# Patient Record
Sex: Female | Born: 1995 | Race: White | Hispanic: No | Marital: Single | Smoking: Current every day smoker
Health system: Southern US, Community
[De-identification: ages and names within clinical notes are randomized; demographics above are authoritative.]

## PROBLEM LIST (undated history)

## (undated) DIAGNOSIS — G43909 Migraine, unspecified, not intractable, without status migrainosus: Secondary | ICD-10-CM

## (undated) DIAGNOSIS — F419 Anxiety disorder, unspecified: Secondary | ICD-10-CM

## (undated) DIAGNOSIS — B009 Herpesviral infection, unspecified: Secondary | ICD-10-CM

## (undated) DIAGNOSIS — J45909 Unspecified asthma, uncomplicated: Secondary | ICD-10-CM

## (undated) HISTORY — DX: Migraine, unspecified, not intractable, without status migrainosus: G43.909

## (undated) HISTORY — DX: Herpesviral infection, unspecified: B00.9

## (undated) HISTORY — PX: SPLENECTOMY, PARTIAL: SHX787

## (undated) HISTORY — DX: Unspecified asthma, uncomplicated: J45.909

## (undated) HISTORY — DX: Anxiety disorder, unspecified: F41.9

---
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
## 2017-08-03 ENCOUNTER — Other Ambulatory Visit: Payer: Self-pay | Admitting: Obstetrics & Gynecology

## 2017-08-03 DIAGNOSIS — O3680X0 Pregnancy with inconclusive fetal viability, not applicable or unspecified: Secondary | ICD-10-CM

## 2017-08-08 ENCOUNTER — Ambulatory Visit (INDEPENDENT_AMBULATORY_CARE_PROVIDER_SITE_OTHER): Payer: Medicaid Other

## 2017-08-08 ENCOUNTER — Encounter (INDEPENDENT_AMBULATORY_CARE_PROVIDER_SITE_OTHER): Payer: Self-pay

## 2017-08-08 DIAGNOSIS — O3680X0 Pregnancy with inconclusive fetal viability, not applicable or unspecified: Secondary | ICD-10-CM | POA: Diagnosis not present

## 2017-08-08 DIAGNOSIS — Z3A10 10 weeks gestation of pregnancy: Secondary | ICD-10-CM

## 2017-08-08 NOTE — Progress Notes (Signed)
US 10 wks,single IUP,CRL 31.58 mm,normal left ovary,simple right ovarian cyst 4.8 x 4 x 4.3 cm,positive fht 176 bpm,EDD 03/06/2018 by LMP

## 2017-08-10 ENCOUNTER — Telehealth: Payer: Self-pay | Admitting: Advanced Practice Midwife

## 2017-08-10 NOTE — Telephone Encounter (Signed)
Pt called asking if it was okay to use her albuterol inhaler during pregnancy. Informed pt that it was safe to use.

## 2017-08-23 ENCOUNTER — Ambulatory Visit: Payer: Self-pay | Admitting: *Deleted

## 2017-08-23 ENCOUNTER — Encounter: Payer: Self-pay | Admitting: Advanced Practice Midwife

## 2017-08-23 ENCOUNTER — Ambulatory Visit (INDEPENDENT_AMBULATORY_CARE_PROVIDER_SITE_OTHER): Payer: Self-pay | Admitting: Advanced Practice Midwife

## 2017-08-23 VITALS — BP 112/64 | HR 88 | Ht 64.0 in | Wt 127.0 lb

## 2017-08-23 DIAGNOSIS — Z331 Pregnant state, incidental: Secondary | ICD-10-CM

## 2017-08-23 DIAGNOSIS — O99341 Other mental disorders complicating pregnancy, first trimester: Secondary | ICD-10-CM

## 2017-08-23 DIAGNOSIS — Z3401 Encounter for supervision of normal first pregnancy, first trimester: Secondary | ICD-10-CM

## 2017-08-23 DIAGNOSIS — Z3A12 12 weeks gestation of pregnancy: Secondary | ICD-10-CM | POA: Diagnosis not present

## 2017-08-23 DIAGNOSIS — Z1389 Encounter for screening for other disorder: Secondary | ICD-10-CM

## 2017-08-23 DIAGNOSIS — Z3400 Encounter for supervision of normal first pregnancy, unspecified trimester: Secondary | ICD-10-CM | POA: Insufficient documentation

## 2017-08-23 DIAGNOSIS — F419 Anxiety disorder, unspecified: Secondary | ICD-10-CM

## 2017-08-23 LAB — POCT URINALYSIS DIPSTICK
Blood, UA: NEGATIVE
Glucose, UA: NEGATIVE
Ketones, UA: NEGATIVE
Leukocytes, UA: NEGATIVE
Nitrite, UA: NEGATIVE
Protein, UA: NEGATIVE

## 2017-08-23 NOTE — Patient Instructions (Signed)
 First Trimester of Pregnancy The first trimester of pregnancy is from week 1 until the end of week 12 (months 1 through 3). A week after a sperm fertilizes an egg, the egg will implant on the wall of the uterus. This embryo will begin to develop into a baby. Genes from you and your partner are forming the baby. The female genes determine whether the baby is a boy or a girl. At 6-8 weeks, the eyes and face are formed, and the heartbeat can be seen on ultrasound. At the end of 12 weeks, all the baby's organs are formed.  Now that you are pregnant, you will want to do everything you can to have a healthy baby. Two of the most important things are to get good prenatal care and to follow your health care provider's instructions. Prenatal care is all the medical care you receive before the baby's birth. This care will help prevent, find, and treat any problems during the pregnancy and childbirth. BODY CHANGES Your body goes through many changes during pregnancy. The changes vary from woman to woman.   You may gain or lose a couple of pounds at first.  You may feel sick to your stomach (nauseous) and throw up (vomit). If the vomiting is uncontrollable, call your health care provider.  You may tire easily.  You may develop headaches that can be relieved by medicines approved by your health care provider.  You may urinate more often. Painful urination may mean you have a bladder infection.  You may develop heartburn as a result of your pregnancy.  You may develop constipation because certain hormones are causing the muscles that push waste through your intestines to slow down.  You may develop hemorrhoids or swollen, bulging veins (varicose veins).  Your breasts may begin to grow larger and become tender. Your nipples may stick out more, and the tissue that surrounds them (areola) may become darker.  Your gums may bleed and may be sensitive to brushing and flossing.  Dark spots or blotches  (chloasma, mask of pregnancy) may develop on your face. This will likely fade after the baby is born.  Your menstrual periods will stop.  You may have a loss of appetite.  You may develop cravings for certain kinds of food.  You may have changes in your emotions from day to day, such as being excited to be pregnant or being concerned that something may go wrong with the pregnancy and baby.  You may have more vivid and strange dreams.  You may have changes in your hair. These can include thickening of your hair, rapid growth, and changes in texture. Some women also have hair loss during or after pregnancy, or hair that feels dry or thin. Your hair will most likely return to normal after your baby is born. WHAT TO EXPECT AT YOUR PRENATAL VISITS During a routine prenatal visit:  You will be weighed to make sure you and the baby are growing normally.  Your blood pressure will be taken.  Your abdomen will be measured to track your baby's growth.  The fetal heartbeat will be listened to starting around week 10 or 12 of your pregnancy.  Test results from any previous visits will be discussed. Your health care provider may ask you:  How you are feeling.  If you are feeling the baby move.  If you have had any abnormal symptoms, such as leaking fluid, bleeding, severe headaches, or abdominal cramping.  If you have any questions. Other   tests that may be performed during your first trimester include:  Blood tests to find your blood type and to check for the presence of any previous infections. They will also be used to check for low iron levels (anemia) and Rh antibodies. Later in the pregnancy, blood tests for diabetes will be done along with other tests if problems develop.  Urine tests to check for infections, diabetes, or protein in the urine.  An ultrasound to confirm the proper growth and development of the baby.  An amniocentesis to check for possible genetic problems.  Fetal  screens for spina bifida and Down syndrome.  You may need other tests to make sure you and the baby are doing well. HOME CARE INSTRUCTIONS  Medicines  Follow your health care provider's instructions regarding medicine use. Specific medicines may be either safe or unsafe to take during pregnancy.  Take your prenatal vitamins as directed.  If you develop constipation, try taking a stool softener if your health care provider approves. Diet  Eat regular, well-balanced meals. Choose a variety of foods, such as meat or vegetable-based protein, fish, milk and low-fat dairy products, vegetables, fruits, and whole grain breads and cereals. Your health care provider will help you determine the amount of weight gain that is right for you.  Avoid raw meat and uncooked cheese. These carry germs that can cause birth defects in the baby.  Eating four or five small meals rather than three large meals a day may help relieve nausea and vomiting. If you start to feel nauseous, eating a few soda crackers can be helpful. Drinking liquids between meals instead of during meals also seems to help nausea and vomiting.  If you develop constipation, eat more high-fiber foods, such as fresh vegetables or fruit and whole grains. Drink enough fluids to keep your urine clear or pale yellow. Activity and Exercise  Exercise only as directed by your health care provider. Exercising will help you:  Control your weight.  Stay in shape.  Be prepared for labor and delivery.  Experiencing pain or cramping in the lower abdomen or low back is a good sign that you should stop exercising. Check with your health care provider before continuing normal exercises.  Try to avoid standing for long periods of time. Move your legs often if you must stand in one place for a long time.  Avoid heavy lifting.  Wear low-heeled shoes, and practice good posture.  You may continue to have sex unless your health care provider directs you  otherwise. Relief of Pain or Discomfort  Wear a good support bra for breast tenderness.   Take warm sitz baths to soothe any pain or discomfort caused by hemorrhoids. Use hemorrhoid cream if your health care provider approves.   Rest with your legs elevated if you have leg cramps or low back pain.  If you develop varicose veins in your legs, wear support hose. Elevate your feet for 15 minutes, 3-4 times a day. Limit salt in your diet. Prenatal Care  Schedule your prenatal visits by the twelfth week of pregnancy. They are usually scheduled monthly at first, then more often in the last 2 months before delivery.  Write down your questions. Take them to your prenatal visits.  Keep all your prenatal visits as directed by your health care provider. Safety  Wear your seat belt at all times when driving.  Make a list of emergency phone numbers, including numbers for family, friends, the hospital, and police and fire departments. General   Tips  Ask your health care provider for a referral to a local prenatal education class. Begin classes no later than at the beginning of month 6 of your pregnancy.  Ask for help if you have counseling or nutritional needs during pregnancy. Your health care provider can offer advice or refer you to specialists for help with various needs.  Do not use hot tubs, steam rooms, or saunas.  Do not douche or use tampons or scented sanitary pads.  Do not cross your legs for long periods of time.  Avoid cat litter boxes and soil used by cats. These carry germs that can cause birth defects in the baby and possibly loss of the fetus by miscarriage or stillbirth.  Avoid all smoking, herbs, alcohol, and medicines not prescribed by your health care provider. Chemicals in these affect the formation and growth of the baby.  Schedule a dentist appointment. At home, brush your teeth with a soft toothbrush and be gentle when you floss. SEEK MEDICAL CARE IF:   You have  dizziness.  You have mild pelvic cramps, pelvic pressure, or nagging pain in the abdominal area.  You have persistent nausea, vomiting, or diarrhea.  You have a bad smelling vaginal discharge.  You have pain with urination.  You notice increased swelling in your face, hands, legs, or ankles. SEEK IMMEDIATE MEDICAL CARE IF:   You have a fever.  You are leaking fluid from your vagina.  You have spotting or bleeding from your vagina.  You have severe abdominal cramping or pain.  You have rapid weight gain or loss.  You vomit blood or material that looks like coffee grounds.  You are exposed to German measles and have never had them.  You are exposed to fifth disease or chickenpox.  You develop a severe headache.  You have shortness of breath.  You have any kind of trauma, such as from a fall or a car accident. Document Released: 08/31/2001 Document Revised: 01/21/2014 Document Reviewed: 07/17/2013 Exit[employer] Patient Information 2015 [employer]. This information is not intended to replace advice given to you by your health care provider. Make sure you discuss any questions you have with your health care provider.   Nausea & Vomiting  Have saltine crackers or pretzels by your bed and eat a few bites before you raise your head out of bed in the morning  Eat small frequent meals throughout the day instead of large meals  Drink plenty of fluids throughout the day to stay hydrated, just don't drink a lot of fluids with your meals.  This can make your stomach fill up faster making you feel sick  Do not brush your teeth right after you eat  Products with real ginger are good for nausea, like ginger ale and ginger hard candy Make sure it says made with real ginger!  Sucking on sour candy like lemon heads is also good for nausea  If your prenatal vitamins make you nauseated, take them at night so you will sleep through the nausea  Sea Bands  If you feel like you need  medicine for the nausea & vomiting please let us know  If you are unable to keep any fluids or food down please let us know   Constipation  Drink plenty of fluid, preferably water, throughout the day  Eat foods high in fiber such as fruits, vegetables, and grains  Exercise, such as walking, is a good way to keep your bowels regular  Drink warm fluids, especially warm   prune juice, or decaf coffee  Eat a 1/2 cup of real oatmeal (not instant), 1/2 cup applesauce, and 1/2-1 cup warm prune juice every day  If needed, you may take Colace (docusate sodium) stool softener once or twice a day to help keep the stool soft. If you are pregnant, wait until you are out of your first trimester (12-14 weeks of pregnancy)  If you still are having problems with constipation, you may take Miralax once daily as needed to help keep your bowels regular.  If you are pregnant, wait until you are out of your first trimester (12-14 weeks of pregnancy)  Safe Medications in Pregnancy   Acne: Benzoyl Peroxide Salicylic Acid  Backache/Headache: Tylenol: 2 regular strength every 4 hours OR              2 Extra strength every 6 hours  Colds/Coughs/Allergies: Benadryl (alcohol free) 25 mg every 6 hours as needed Breath right strips Claritin Cepacol throat lozenges Chloraseptic throat spray Cold-Eeze- up to three times per day Cough drops, alcohol free Flonase (by prescription only) Guaifenesin Mucinex Robitussin DM (plain only, alcohol free) Saline nasal spray/drops Sudafed (pseudoephedrine) & Actifed ** use only after [redacted] weeks gestation and if you do not have high blood pressure Tylenol Vicks Vaporub Zinc lozenges Zyrtec   Constipation: Colace Ducolax suppositories Fleet enema Glycerin suppositories Metamucil Milk of magnesia Miralax Senokot Smooth move tea  Diarrhea: Kaopectate Imodium A-D  *NO pepto Bismol  Hemorrhoids: Anusol Anusol HC Preparation  H Tucks  Indigestion: Tums Maalox Mylanta Zantac  Pepcid  Insomnia: Benadryl (alcohol free) 25mg every 6 hours as needed Tylenol PM Unisom, no Gelcaps  Leg Cramps: Tums MagGel  Nausea/Vomiting:  Bonine Dramamine Emetrol Ginger extract Sea bands Meclizine  Nausea medication to take during pregnancy:  Unisom (doxylamine succinate 25 mg tablets) Take one tablet daily at bedtime. If symptoms are not adequately controlled, the dose can be increased to a maximum recommended dose of two tablets daily (1/2 tablet in the morning, 1/2 tablet mid-afternoon and one at bedtime). Vitamin B6 100mg tablets. Take one tablet twice a day (up to 200 mg per day).  Skin Rashes: Aveeno products Benadryl cream or 25mg every 6 hours as needed Calamine Lotion 1% cortisone cream  Yeast infection: Gyne-lotrimin 7 Monistat 7   **If taking multiple medications, please check labels to avoid duplicating the same active ingredients **take medication as directed on the label ** Do not exceed 4000 mg of tylenol in 24 hours **Do not take medications that contain aspirin or ibuprofen      

## 2017-08-23 NOTE — Progress Notes (Signed)
  Subjective:    Jennifer Obrien is a G1P0 4336w1d being seen today for her first obstetrical visit.  Her obstetrical history is significant for first baby.  Pregnancy history fully reviewed.  Patient reports no complaints.  Vitals:   08/23/17 1424 08/23/17 1425  BP: 112/64   Pulse: 88   Weight: 127 lb (57.6 kg)   Height:  5\' 4"  (1.626 m)    HISTORY: OB History  Gravida Para Term Preterm AB Living  1            SAB TAB Ectopic Multiple Live Births               # Outcome Date GA Lbr Len/2nd Weight Sex Delivery Anes PTL Lv  1 Current              Past Medical History:  Diagnosis Date  . Anxiety   . Asthma   . HSV-1 (herpes simplex virus 1) infection    Past Surgical History:  Procedure Laterality Date  . 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 Hypertension Mother   . Hernia Father   . Diabetes Maternal Uncle   . Hypertension Maternal Grandmother   . Alcohol abuse Maternal Grandfather   . Hypertension Paternal Grandmother      Exam                                      System:     Skin: normal coloration and turgor, no rashes    Neurologic: oriented, normal, normal mood   Extremities: normal strength, tone, and muscle mass   HEENT PERRLA   Mouth/Teeth mucous membranes moist, normal dentition   Neck supple and no masses   Cardiovascular: regular rate and rhythm   Respiratory:  appears well, vitals normal, no respiratory distress, acyanotic   Abdomen: soft, non-tender;  FHR: 160us        The nature [name] with multiple MDs and other Advanced Practice Providers was explained to patient; also emphasized that residents, students are part of our team.  Assessment:    Pregnancy: G1P0 Patient Active Problem List   Diagnosis Date Noted  . Supervision of normal first pregnancy 08/23/2017        Plan:     Initial labs drawn. Continue prenatal vitamins   Problem list reviewed and updated  Reviewed n/v relief measures and warning s/s to report  Reviewed recommended weight gain based on pre-gravid BMI  Encouraged well-balanced diet Genetic Screening discussed : declined.  Ultrasound discussed; fetal survey: requested.  Return in about 4 weeks (around 09/20/2017) for LROB.  CRESENZO-DISHMAN,Shady Bradish 08/23/2017

## 2017-08-24 LAB — OBSTETRIC PANEL, INCLUDING HIV
Antibody Screen: NEGATIVE
Basophils Absolute: 0 10*3/uL (ref 0.0–0.2)
Basos: 0 %
EOS (ABSOLUTE): 0.2 10*3/uL (ref 0.0–0.4)
Eos: 2 %
HIV Screen 4th Generation wRfx: NONREACTIVE
Hematocrit: 37.7 % (ref 34.0–46.6)
Hemoglobin: 12.9 g/dL (ref 11.1–15.9)
Hepatitis B Surface Ag: NEGATIVE
Immature Grans (Abs): 0 10*3/uL (ref 0.0–0.1)
Immature Granulocytes: 0 %
Lymphocytes Absolute: 2.6 10*3/uL (ref 0.7–3.1)
Lymphs: 27 %
MCH: 31.4 pg (ref 26.6–33.0)
MCHC: 34.2 g/dL (ref 31.5–35.7)
MCV: 92 fL (ref 79–97)
Monocytes Absolute: 0.7 10*3/uL (ref 0.1–0.9)
Monocytes: 7 %
Neutrophils Absolute: 6 10*3/uL (ref 1.4–7.0)
Neutrophils: 64 %
Platelets: 230 10*3/uL (ref 150–379)
RBC: 4.11 x10E6/uL (ref 3.77–5.28)
RDW: 13.8 % (ref 12.3–15.4)
RPR Ser Ql: NONREACTIVE
Rh Factor: POSITIVE
Rubella Antibodies, IGG: 4.56 index (ref >0.99)
WBC: 9.5 10*3/uL (ref 3.4–10.8)

## 2017-08-24 LAB — URINALYSIS, ROUTINE W REFLEX MICROSCOPIC
Bilirubin, UA: NEGATIVE
Glucose, UA: NEGATIVE
Ketones, UA: NEGATIVE
Leukocytes, UA: NEGATIVE
Nitrite, UA: NEGATIVE
Protein, UA: NEGATIVE
RBC, UA: NEGATIVE
Specific Gravity, UA: 1.019 (ref 1.005–1.030)
Urobilinogen, Ur: 1 mg/dL (ref 0.2–1.0)
pH, UA: 7 (ref 5.0–7.5)

## 2017-08-24 LAB — VARICELLA ZOSTER ANTIBODY, IGG: Varicella zoster IgG: 900 index (ref >165)

## 2017-08-24 LAB — PMP SCREEN PROFILE (10S), URINE
Amphetamine Scrn, Ur: NEGATIVE ng/mL
BARBITURATE SCREEN URINE: NEGATIVE ng/mL
BENZODIAZEPINE SCREEN, URINE: NEGATIVE ng/mL
CANNABINOIDS UR QL SCN: NEGATIVE ng/mL
Cocaine (Metab) Scrn, Ur: NEGATIVE ng/mL
Creatinine(Crt), U: 188.7 mg/dL (ref 20.0–300.0)
Methadone Screen, Urine: NEGATIVE ng/mL
OXYCODONE+OXYMORPHONE UR QL SCN: NEGATIVE ng/mL
Opiate Scrn, Ur: NEGATIVE ng/mL
Ph of Urine: 6.5 (ref 4.5–8.9)
Phencyclidine Qn, Ur: NEGATIVE ng/mL
Propoxyphene Scrn, Ur: NEGATIVE ng/mL

## 2017-08-25 LAB — URINE CULTURE: Organism ID, Bacteria: NO GROWTH

## 2017-09-20 NOTE — L&D Delivery Note (Addendum)
Delivery Note Jennifer BrooksDakota Obrien is a 22 yo G1 now P1001 s/p SVD at 5244w0d after SOL. Shortly before delivery SROM occurred with clear fluid.   At 9:39 AM after pushing for approximately 50 minutes a viable female was delivered via Vaginal, Spontaneous (Presentation: LOA ; compound hand presentation with anterior hand  ).  APGAR: , 9; weight  pending . Vaginal septum separated during delivery and was hemostatic.  Shortly after delivery cord was clamped and cut and infant was transferred to radiant warmer for brief PPV.   Attention was then turned to active management of the third stage of labor with gentle downward traction via Brandt maneuver. Placenta status: delivered spontaneously, intact, 3-vessel cord.  Cord pH: pending  Anesthesia:  epidural Episiotomy: None Lacerations:  Vaginal septum separated during delivery and was hemostatic; 2nd degree right-sided perineal and right-sided superior labial tear Suture Repair: 3.0 vicryl rapide Est. Blood Loss (mL): 100  Mom to postpartum.  Baby to Couplet care / Skin to Skin.  Jennifer Obrien 03/06/2018, 10:03 AM  .I confirm that I have verified the information documented in the resident's note and that I have also personally reperformed the physical exam and all medical decision making activities.  I was gloved and present for entire delivery SVD without incident No difficulty with shoulders Lacerations as listed above Repair of same supervised by me Jennifer Obrien, CNM  .Please schedule this patient for Postpartum visit in: 4 weeks with the following provider: Any provider For C/S patients schedule nurse incision check in weeks 2 weeks: no Low risk pregnancy complicated by: none Delivery mode:  SVD Anticipated Birth Control:  POPs PP Procedures needed: none  Schedule Integrated BH visit: no

## 2017-09-21 ENCOUNTER — Encounter: Payer: Self-pay | Admitting: Obstetrics and Gynecology

## 2017-09-21 ENCOUNTER — Encounter (INDEPENDENT_AMBULATORY_CARE_PROVIDER_SITE_OTHER): Payer: Self-pay

## 2017-09-21 ENCOUNTER — Ambulatory Visit (INDEPENDENT_AMBULATORY_CARE_PROVIDER_SITE_OTHER): Payer: Self-pay | Admitting: Obstetrics and Gynecology

## 2017-09-21 VITALS — BP 110/62 | HR 85 | Wt 127.6 lb

## 2017-09-21 DIAGNOSIS — Z331 Pregnant state, incidental: Secondary | ICD-10-CM

## 2017-09-21 DIAGNOSIS — Z1389 Encounter for screening for other disorder: Secondary | ICD-10-CM

## 2017-09-21 DIAGNOSIS — Z3A16 16 weeks gestation of pregnancy: Secondary | ICD-10-CM

## 2017-09-21 DIAGNOSIS — Z3402 Encounter for supervision of normal first pregnancy, second trimester: Secondary | ICD-10-CM

## 2017-09-21 LAB — POCT URINALYSIS DIPSTICK
Blood, UA: NEGATIVE
Glucose, UA: NEGATIVE
Ketones, UA: NEGATIVE
Leukocytes, UA: NEGATIVE
Nitrite, UA: NEGATIVE
Protein, UA: NEGATIVE

## 2017-09-21 NOTE — Patient Instructions (Signed)
(  [phone] is the phone number for Pregnancy Classes or hospital tours at [facility].  ° °You will be referred to  [email or web address]   for more information on childbirth classes   °At this site you may register for classes. You may sign up for a waiting list if classes are full. Please SIGN UP FOR THIS!.   When the waiting list becomes long, sometimes new classes can be added. ° ° ° °

## 2017-09-21 NOTE — Progress Notes (Addendum)
Patient ID: Jennifer Obrien, female   DOB: [birth year], 22 y.o.   MRN: [id number]   LOW-RISK PREGNANCY VISIT Patient name: Jennifer Obrien MRN [id number]  Date of birth: [birth year] Chief Complaint:   Routine Prenatal Visit  History of Present Illness:   Jennifer Obrien is a 22 y.o. G1P0 female at 7928w2d with an Estimated Date of Delivery: 03/06/18 being seen today for ongoing management of a low-risk pregnancy.  Today she reports no complaints.  . Vag. Bleeding: None.  Movement: Present. denies leaking of fluid. Review of Systems:   Pertinent items are noted in HPI Denies abnormal vaginal discharge w/ itching/odor/irritation, headaches, visual changes, shortness of breath, chest pain, abdominal pain, severe nausea/vomiting, or problems with urination or bowel movements unless otherwise stated above. Pertinent History Reviewed:  Reviewed past medical,surgical, social, obstetrical and family history.  Reviewed problem list, medications and allergies. Physical Assessment:   Vitals:   09/21/17 1400  BP: 110/62  Pulse: 85  Weight: 127 lb 9.6 oz (57.9 kg)  Body mass index is 21.9 kg/m.        Physical Examination:   General appearance: Well appearing, and in no distress  Mental status: Alert, oriented to person, place, and time  Skin: Warm & dry  Cardiovascular: Normal heart rate noted  Respiratory: Normal respiratory effort, no distress  Abdomen: Soft, gravid, nontender  Pelvic: Cervical exam deferred         Extremities: Edema: None  Fetal Status: Fetal Heart Rate (bpm): 152-154   Movement: Present    Results for orders placed or performed in visit on 09/21/17 (from the past 24 hour(s))  POCT urinalysis dipstick   Collection Time: 09/21/17  2:01 PM  Result Value Ref Range   Color, UA     Clarity, UA     Glucose, UA neg    Bilirubin, UA     Ketones, UA neg    Spec Grav, UA  1.010 - 1.025   Blood, UA neg    pH, UA  5.0 - 8.0   Protein, UA neg    Urobilinogen, UA  0.2 or 1.0  E.U./dL   Nitrite, UA neg    Leukocytes, UA Negative Negative   Appearance     Odor      Assessment & Plan:  1) Low-risk pregnancy G1P0 at 3628w2d with an Estimated Date of Delivery: 03/06/18    Labs/procedures today: Doppler  Plan:  Continue routine obstetrical care   Reviewed: Preterm labor symptoms and general obstetric precautions including but not limited to vaginal bleeding, contractions, leaking of fluid and fetal movement were reviewed in detail with the patient.  All questions were answered  Follow-up: Return in about 4 weeks (around 10/19/2017) for LROB, U/S: OB FU .anatomy. Childbirth classes encouraged: PARTNER travels a lot, Friday/Sat classes more likely  Orders Placed This Encounter  Procedures  . POCT urinalysis dipstick   By signing my name below, I, Diona BrownerJennifer Gorman, attest that this documentation has been prepared under the direction and in the presence of Tilda BurrowFerguson, Shyanna Klingel V, MD. Electronically Signed: Diona BrownerJennifer Gorman, Medical Scribe. 09/21/17. 2:31 PM.  I personally performed the services described in this documentation, which was SCRIBED in my presence. The recorded information has been reviewed and considered accurate. It has been edited as necessary during review. Tilda BurrowJohn V Srinidhi Landers, MD

## 2017-10-18 ENCOUNTER — Other Ambulatory Visit: Payer: Self-pay | Admitting: Obstetrics and Gynecology

## 2017-10-18 DIAGNOSIS — Z363 Encounter for antenatal screening for malformations: Secondary | ICD-10-CM

## 2017-10-19 ENCOUNTER — Ambulatory Visit (INDEPENDENT_AMBULATORY_CARE_PROVIDER_SITE_OTHER): Payer: Medicaid Other | Admitting: Advanced Practice Midwife

## 2017-10-19 ENCOUNTER — Ambulatory Visit (INDEPENDENT_AMBULATORY_CARE_PROVIDER_SITE_OTHER): Payer: Medicaid Other

## 2017-10-19 VITALS — BP 122/66 | HR 80 | Wt 132.0 lb

## 2017-10-19 DIAGNOSIS — N8311 Corpus luteum cyst of right ovary: Secondary | ICD-10-CM

## 2017-10-19 DIAGNOSIS — Z3A20 20 weeks gestation of pregnancy: Secondary | ICD-10-CM

## 2017-10-19 DIAGNOSIS — Z1389 Encounter for screening for other disorder: Secondary | ICD-10-CM

## 2017-10-19 DIAGNOSIS — Z3402 Encounter for supervision of normal first pregnancy, second trimester: Secondary | ICD-10-CM

## 2017-10-19 DIAGNOSIS — Z363 Encounter for antenatal screening for malformations: Secondary | ICD-10-CM

## 2017-10-19 DIAGNOSIS — O3482 Maternal care for other abnormalities of pelvic organs, second trimester: Secondary | ICD-10-CM

## 2017-10-19 DIAGNOSIS — Z331 Pregnant state, incidental: Secondary | ICD-10-CM

## 2017-10-19 LAB — POCT URINALYSIS DIPSTICK
Blood, UA: NEGATIVE
Glucose, UA: NEGATIVE
Ketones, UA: NEGATIVE
Leukocytes, UA: NEGATIVE
Nitrite, UA: NEGATIVE
Protein, UA: NEGATIVE

## 2017-10-19 NOTE — Progress Notes (Signed)
US 20+2 wks,cephalic,cx 3.6 cm,posterior pl gr 0,normal left ovary,simple right corpus luteal cyst 4.6 x 3.2 x 3.9 cm,SVP of fluid 4.6 cm,fhr 157 bpm,EFW 320 g,anatomy complete,no obvious abnormalities

## 2017-10-19 NOTE — Progress Notes (Signed)
G1P0 3475w2d Estimated Date of Delivery: 03/06/18  Blood pressure 122/66, pulse 80, weight 132 lb (59.9 kg), last menstrual period 05/30/2017.   BP weight and urine results all reviewed and noted.  Please refer to the obstetrical flow sheet for the fundal height and fetal heart rate documentation:  US 20+2 wks,cephalic,cx 3.6 cm,posterior pl gr 0,normal left ovary,simple right corpus luteal cyst 4.6 x 3.2 x 3.9 cm,SVP of fluid 4.6 cm,fhr 157 bpm,EFW 320 g,anatomy complete,no obvious abnormalities     Patient reports good fetal movement, denies any bleeding and no rupture of membranes symptoms or regular contractions. Patient is without complaints. All questions were answered.  Orders Placed This Encounter  Procedures  . POCT Urinalysis Dipstick    Plan:  Continued routine obstetrical care,   Return in about 4 weeks (around 11/16/2017) for LROB.

## 2017-11-16 ENCOUNTER — Encounter: Payer: Self-pay | Admitting: Advanced Practice Midwife

## 2017-11-16 ENCOUNTER — Ambulatory Visit (INDEPENDENT_AMBULATORY_CARE_PROVIDER_SITE_OTHER): Payer: Medicaid Other | Admitting: Advanced Practice Midwife

## 2017-11-16 VITALS — BP 120/60 | HR 99 | Wt 141.4 lb

## 2017-11-16 DIAGNOSIS — R1012 Left upper quadrant pain: Secondary | ICD-10-CM

## 2017-11-16 DIAGNOSIS — Z3402 Encounter for supervision of normal first pregnancy, second trimester: Secondary | ICD-10-CM

## 2017-11-16 DIAGNOSIS — O9989 Other specified diseases and conditions complicating pregnancy, childbirth and the puerperium: Secondary | ICD-10-CM

## 2017-11-16 DIAGNOSIS — Z3A24 24 weeks gestation of pregnancy: Secondary | ICD-10-CM

## 2017-11-16 DIAGNOSIS — Z1389 Encounter for screening for other disorder: Secondary | ICD-10-CM

## 2017-11-16 DIAGNOSIS — Z331 Pregnant state, incidental: Secondary | ICD-10-CM

## 2017-11-16 LAB — POCT URINALYSIS DIPSTICK
Blood, UA: NEGATIVE
Glucose, UA: NEGATIVE
Ketones, UA: NEGATIVE
Leukocytes, UA: NEGATIVE
Nitrite, UA: NEGATIVE
Protein, UA: NEGATIVE

## 2017-11-16 NOTE — Progress Notes (Addendum)
  G1P0 1944w2d Estimated Date of Delivery: 03/06/18  Blood pressure 120/60, pulse 99, weight 141 lb 6.4 oz (64.1 kg), last menstrual period 05/30/2017.   BP weight and urine results all reviewed and noted.  Please refer to the obstetrical flow sheet for the fundal height and fetal heart rate documentation:  Patient reports good fetal movement, denies any bleeding and no rupture of membranes symptoms or regular contractions. Patient has LUQ pain for 2 weeks. 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  Feels like the same pain.  All questions were answered.   Physical Assessment:   Vitals:   11/16/17 1411  BP: 120/60  Pulse: 99  Weight: 141 lb 6.4 oz (64.1 kg)  Body mass index is 24.27 kg/m.        Physical Examination:   General appearance: Well appearing, and in no distress  Mental status: Alert, oriented to person, place, and time  Skin: Warm & dry  Cardiovascular: Normal heart rate noted  Respiratory: Normal respiratory effort, no distress  Abdomen: Soft, gravid, nontender  Pelvic: Cervical exam deferred         Extremities: Edema: None  Fetal Status:     Movement: Present    Results for orders placed or performed in visit on 11/16/17 (from the past 24 hour(s))  POCT urinalysis dipstick   Collection Time: 11/16/17  2:18 PM  Result Value Ref Range   Color, UA     Clarity, UA     Glucose, UA neg    Bilirubin, UA     Ketones, UA neg    Spec Grav, UA  1.010 - 1.025   Blood, UA neg    pH, UA  5.0 - 8.0   Protein, UA neg    Urobilinogen, UA  0.2 or 1.0 E.U./dL   Nitrite, UA neg    Leukocytes, UA Negative Negative   Appearance     Odor       Orders Placed This Encounter  Procedures  . POCT urinalysis dipstick    Plan:  Continued routine obstetrical care, Spleen  Return in about 3 weeks (around 12/07/2017) for PN2/LROB.

## 2017-11-16 NOTE — Addendum Note (Signed)
Addended by: Jacklyn ShellRESENZO-DISHMON, Hyrum Shaneyfelt on: 11/16/2017 03:16 PM   Modules accepted: Orders

## 2017-11-16 NOTE — Patient Instructions (Signed)

## 2017-11-17 ENCOUNTER — Telehealth: Payer: Self-pay | Admitting: *Deleted

## 2017-11-17 NOTE — Telephone Encounter (Signed)
LMOVM U/S scheduled for 3/6 @ 9:15 [facility]. Nothing to eat or drink 6 hours prior to U/S.

## 2017-11-23 ENCOUNTER — Telehealth: Payer: Self-pay | Admitting: Obstetrics & Gynecology

## 2017-11-23 ENCOUNTER — Ambulatory Visit (HOSPITAL_COMMUNITY)
Admission: RE | Admit: 2017-11-23 | Discharge: 2017-11-23 | Disposition: A | Discharge status: Home / Self Care | Payer: Medicaid Other | Source: Ambulatory Visit | Attending: Advanced Practice Midwife | Admitting: Advanced Practice Midwife

## 2017-11-23 DIAGNOSIS — R1012 Left upper quadrant pain: Secondary | ICD-10-CM | POA: Insufficient documentation

## 2017-11-23 DIAGNOSIS — D734 Cyst of spleen: Secondary | ICD-10-CM | POA: Insufficient documentation

## 2017-11-25 ENCOUNTER — Encounter: Payer: Self-pay | Admitting: Advanced Practice Midwife

## 2017-12-08 ENCOUNTER — Other Ambulatory Visit: Payer: Medicaid Other

## 2017-12-08 ENCOUNTER — Encounter: Payer: Self-pay | Admitting: Women's Health

## 2017-12-08 ENCOUNTER — Ambulatory Visit (INDEPENDENT_AMBULATORY_CARE_PROVIDER_SITE_OTHER): Payer: Medicaid Other | Admitting: Women's Health

## 2017-12-08 VITALS — BP 102/70 | HR 98 | Wt 144.0 lb

## 2017-12-08 DIAGNOSIS — Z131 Encounter for screening for diabetes mellitus: Secondary | ICD-10-CM

## 2017-12-08 DIAGNOSIS — Z9081 Acquired absence of spleen: Secondary | ICD-10-CM | POA: Insufficient documentation

## 2017-12-08 DIAGNOSIS — Z3402 Encounter for supervision of normal first pregnancy, second trimester: Secondary | ICD-10-CM

## 2017-12-08 DIAGNOSIS — Z113 Encounter for screening for infections with a predominantly sexual mode of transmission: Secondary | ICD-10-CM

## 2017-12-08 DIAGNOSIS — Z3A27 27 weeks gestation of pregnancy: Secondary | ICD-10-CM

## 2017-12-08 DIAGNOSIS — O9989 Other specified diseases and conditions complicating pregnancy, childbirth and the puerperium: Secondary | ICD-10-CM

## 2017-12-08 MED ORDER — VALACYCLOVIR HCL 1 G PO TABS: 2000.0000 mg | ORAL_TABLET | Freq: Two times a day (BID) | ORAL | 3 refills | Status: AC

## 2017-12-08 NOTE — Patient Instructions (Signed)
Jennifer Obrien, I greatly value your feedback.  If you receive a survey following your visit with us today, we appreciate you taking the time to fill it out.  Thanks, Joellyn HaffKim Saje Gallop, CNM, WHNP-BC   Call the office [phone]) or go to [facility] if:  You begin to have strong, frequent contractions  Your water breaks.  Sometimes it is a big gush of fluid, sometimes it is just a trickle that keeps getting your panties wet or running down your legs  You have vaginal bleeding.  It is normal to have a small amount of spotting if your cervix was checked.   You don't feel your baby moving like normal.  If you don't, get you something to eat and drink and lay down and focus on feeling your baby move.  You should feel at least 10 movements in 2 hours.  If you don't, you should call the office or go to [facility].    Tdap Vaccine  It is recommended that you get the Tdap vaccine during the third trimester of EACH pregnancy to help protect your baby from getting pertussis (whooping cough)  27-36 weeks is the BEST time to do this so that you can pass the protection on to your baby. During pregnancy is better than after pregnancy, but if you are unable to get it during pregnancy it will be offered at the hospital.   You can get this vaccine at the health department or your family doctor  Everyone who will be around your baby should also be up-to-date on their vaccines. Adults (who are not pregnant) only need 1 dose of Tdap during adulthood.   Third Trimester of Pregnancy The third trimester is from week 29 through week 42, months 7 through 9. The third trimester is a time when the fetus is growing rapidly. At the end of the ninth month, the fetus is about 20 inches in length and weighs 6-10 pounds.  BODY CHANGES Your body goes through many changes during pregnancy. The changes vary from woman to woman.   Your weight will continue to increase. You can expect to gain 25-35 pounds (11-16 kg) by  the end of the pregnancy.  You may begin to get stretch marks on your hips, abdomen, and breasts.  You may urinate more often because the fetus is moving lower into your pelvis and pressing on your bladder.  You may develop or continue to have heartburn as a result of your pregnancy.  You may develop constipation because certain hormones are causing the muscles that push waste through your intestines to slow down.  You may develop hemorrhoids or swollen, bulging veins (varicose veins).  You may have pelvic pain because of the weight gain and pregnancy hormones relaxing your joints between the bones in your pelvis. Backaches may result from overexertion of the muscles supporting your posture.  You may have changes in your hair. These can include thickening of your hair, rapid growth, and changes in texture. Some women also have hair loss during or after pregnancy, or hair that feels dry or thin. Your hair will most likely return to normal after your baby is born.  Your breasts will continue to grow and be tender. A yellow discharge may leak from your breasts called colostrum.  Your belly button may stick out.  You may feel short of breath because of your expanding uterus.  You may notice the fetus "dropping," or moving lower in your abdomen.  You may have a bloody mucus  discharge. This usually occurs a few days to a week before labor begins.  Your cervix becomes thin and soft (effaced) near your due date. WHAT TO EXPECT AT YOUR PRENATAL EXAMS  You will have prenatal exams every 2 weeks until week 36. Then, you will have weekly prenatal exams. During a routine prenatal visit:  You will be weighed to make sure you and the fetus are growing normally.  Your blood pressure is taken.  Your abdomen will be measured to track your baby's growth.  The fetal heartbeat will be listened to.  Any test results from the previous visit will be discussed.  You may have a cervical check near your  due date to see if you have effaced. At around 36 weeks, your caregiver will check your cervix. At the same time, your caregiver will also perform a test on the secretions of the vaginal tissue. This test is to determine if a type of bacteria, Group B streptococcus, is present. Your caregiver will explain this further. Your caregiver may ask you:  What your birth plan is.  How you are feeling.  If you are feeling the baby move.  If you have had any abnormal symptoms, such as leaking fluid, bleeding, severe headaches, or abdominal cramping.  If you have any questions. Other tests or screenings that may be performed during your third trimester include:  Blood tests that check for low iron levels (anemia).  Fetal testing to check the health, activity level, and growth of the fetus. Testing is done if you have certain medical conditions or if there are problems during the pregnancy. FALSE LABOR You may feel small, irregular contractions that eventually go away. These are called Braxton Hicks contractions, or false labor. Contractions may last for hours, days, or even weeks before true labor sets in. If contractions come at regular intervals, intensify, or become painful, it is best to be seen by your caregiver.  SIGNS OF LABOR   Menstrual-like cramps.  Contractions that are 5 minutes apart or less.  Contractions that start on the top of the uterus and spread down to the lower abdomen and back.  A sense of increased pelvic pressure or back pain.  A watery or bloody mucus discharge that comes from the vagina. If you have any of these signs before the 37th week of pregnancy, call your caregiver right away. You need to go to the hospital to get checked immediately. HOME CARE INSTRUCTIONS   Avoid all smoking, herbs, alcohol, and unprescribed drugs. These chemicals affect the formation and growth of the baby.  Follow your caregiver's instructions regarding medicine use. There are medicines  that are either safe or unsafe to take during pregnancy.  Exercise only as directed by your caregiver. Experiencing uterine cramps is a good sign to stop exercising.  Continue to eat regular, healthy meals.  Wear a good support bra for breast tenderness.  Do not use hot tubs, steam rooms, or saunas.  Wear your seat belt at all times when driving.  Avoid raw meat, uncooked cheese, cat litter boxes, and soil used by cats. These carry germs that can cause birth defects in the baby.  Take your prenatal vitamins.  Try taking a stool softener (if your caregiver approves) if you develop constipation. Eat more high-fiber foods, such as fresh vegetables or fruit and whole grains. Drink plenty of fluids to keep your urine clear or pale yellow.  Take warm sitz baths to soothe any pain or discomfort caused by hemorrhoids. Use  hemorrhoid cream if your caregiver approves.  If you develop varicose veins, wear support hose. Elevate your feet for 15 minutes, 3-4 times a day. Limit salt in your diet.  Avoid heavy lifting, wear low heal shoes, and practice good posture.  Rest a lot with your legs elevated if you have leg cramps or low back pain.  Visit your dentist if you have not gone during your pregnancy. Use a soft toothbrush to brush your teeth and be gentle when you floss.  A sexual relationship may be continued unless your caregiver directs you otherwise.  Do not travel far distances unless it is absolutely necessary and only with the approval of your caregiver.  Take prenatal classes to understand, practice, and ask questions about the labor and delivery.  Make a trial run to the hospital.  Pack your hospital bag.  Prepare the baby's nursery.  Continue to go to all your prenatal visits as directed by your caregiver. SEEK MEDICAL CARE IF:  You are unsure if you are in labor or if your water has broken.  You have dizziness.  You have mild pelvic cramps, pelvic pressure, or nagging  pain in your abdominal area.  You have persistent nausea, vomiting, or diarrhea.  You have a bad smelling vaginal discharge.  You have pain with urination. SEEK IMMEDIATE MEDICAL CARE IF:   You have a fever.  You are leaking fluid from your vagina.  You have spotting or bleeding from your vagina.  You have severe abdominal cramping or pain.  You have rapid weight loss or gain.  You have shortness of breath with chest pain.  You notice sudden or extreme swelling of your face, hands, ankles, feet, or legs.  You have not felt your baby move in over an hour.  You have severe headaches that do not go away with medicine.  You have vision changes. Document Released: 08/31/2001 Document Revised: 09/11/2013 Document Reviewed: 11/07/2012 [facility] Patient Information 2015 [place], [place]. This information is not intended to replace advice given to you by your health care provider. Make sure you discuss any questions you have with your health care provider.

## 2017-12-08 NOTE — Progress Notes (Signed)
   LOW-RISK PREGNANCY VISIT Patient name: Jennifer Obrien MRN [id number]  Date of birth: Mar 17, 1996 Chief Complaint:   Routine Prenatal Visit (PN2)  History of Present Illness:   Jennifer Obrien is a 22 y.o. G1P0 female at 6152w3d with an Estimated Date of Delivery: 03/06/18 being seen today for ongoing management of a low-risk pregnancy.  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 Unable to void right now, will try again before she leaves. Fever blister, requests rx for valtrex.   Contractions: Not present. Vag. Bleeding: None.  Movement: Present. denies leaking of fluid. Review of Systems:   Pertinent items are noted in HPI Denies abnormal vaginal discharge w/ itching/odor/irritation, headaches, visual changes, shortness of breath, chest pain, abdominal pain, severe nausea/vomiting, or problems with urination or bowel movements unless otherwise stated above. Pertinent History Reviewed:  Reviewed past medical,surgical, social, obstetrical and family history.  Reviewed problem list, medications and allergies. Physical Assessment:   Vitals:   12/08/17 0920  BP: 102/70  Pulse: 98  Weight: 144 lb (65.3 kg)  Body mass index is 24.72 kg/m.        Physical Examination:   General appearance: Well appearing, and in no distress  Mental status: Alert, oriented to person, place, and time  Skin: Warm & dry  Cardiovascular: Normal heart rate noted  Respiratory: Normal respiratory effort, no distress  Abdomen: Soft, gravid, nontender  Pelvic: Cervical exam deferred         Extremities: Edema: None  Fetal Status: Fetal Heart Rate (bpm): 150 Fundal Height: 27 cm Movement: Present    No results found for this or any previous visit (from the past 24 hour(s)).  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 To make appt w/ [facility] next visit to discuss further/mode of delivery, etc  3) Breasts leaking milk> discussed normal  4) Fever blister> rx valtrex   Meds:  Meds ordered this encounter  Medications  . valACYclovir (VALTREX) 1000 MG tablet    Sig: Take 2 tablets (2,000 mg total) by mouth 2 (two) times daily. X 1 day    Dispense:  4 tablet    Refill:  3    Order Specific Question:   Supervising Provider    Answer:   Duane LopeEURE, LUTHER H [[id number]]   Labs/procedures today: pn2, gc/ct (not done at new ob)  Plan:  Continue routine obstetrical care   Reviewed: Preterm labor symptoms and general obstetric precautions including but not limited to vaginal bleeding, contractions, leaking of fluid and fetal movement were reviewed in detail with the patient.  All questions were answered  Follow-up: Return in about 1 month (around 01/05/2018) for LROB w/ [facility].  Orders Placed This Encounter  Procedures  . GC/Chlamydia Probe Amp   Cheral MarkerKimberly R Jlee Harkless CNM, [facility] 12/08/2017 10:42 AM

## 2017-12-09 LAB — CBC
Hematocrit: 32.9 % — ABNORMAL LOW (ref 34.0–46.6)
Hemoglobin: 10.8 g/dL — ABNORMAL LOW (ref 11.1–15.9)
MCH: 31.8 pg (ref 26.6–33.0)
MCHC: 32.8 g/dL (ref 31.5–35.7)
MCV: 97 fL (ref 79–97)
Platelets: 234 10*3/uL (ref 150–379)
RBC: 3.4 x10E6/uL — ABNORMAL LOW (ref 3.77–5.28)
RDW: 13.3 % (ref 12.3–15.4)
WBC: 9.3 10*3/uL (ref 3.4–10.8)

## 2017-12-09 LAB — GLUCOSE TOLERANCE, 2 HOURS W/ 1HR
Glucose, 1 hour: 117 mg/dL (ref 65–179)
Glucose, 2 hour: 55 mg/dL — ABNORMAL LOW (ref 65–152)
Glucose, Fasting: 73 mg/dL (ref 65–91)

## 2017-12-09 LAB — RPR: RPR Ser Ql: NONREACTIVE

## 2017-12-09 LAB — ANTIBODY SCREEN: Antibody Screen: NEGATIVE

## 2017-12-09 LAB — HIV ANTIBODY (ROUTINE TESTING W REFLEX): HIV Screen 4th Generation wRfx: NONREACTIVE

## 2017-12-10 LAB — GC/CHLAMYDIA PROBE AMP
Chlamydia trachomatis, NAA: NEGATIVE
Neisseria gonorrhoeae by PCR: NEGATIVE

## 2018-01-05 ENCOUNTER — Ambulatory Visit (INDEPENDENT_AMBULATORY_CARE_PROVIDER_SITE_OTHER): Payer: Medicaid Other | Admitting: Obstetrics & Gynecology

## 2018-01-05 ENCOUNTER — Encounter: Payer: Self-pay | Admitting: Obstetrics & Gynecology

## 2018-01-05 ENCOUNTER — Other Ambulatory Visit: Payer: Self-pay

## 2018-01-05 VITALS — BP 112/74 | HR 93 | Wt 150.0 lb

## 2018-01-05 DIAGNOSIS — Z331 Pregnant state, incidental: Secondary | ICD-10-CM

## 2018-01-05 DIAGNOSIS — Z1389 Encounter for screening for other disorder: Secondary | ICD-10-CM

## 2018-01-05 DIAGNOSIS — Z3A31 31 weeks gestation of pregnancy: Secondary | ICD-10-CM

## 2018-01-05 DIAGNOSIS — Z3403 Encounter for supervision of normal first pregnancy, third trimester: Secondary | ICD-10-CM

## 2018-01-05 LAB — POCT URINALYSIS DIPSTICK
Blood, UA: NEGATIVE
Glucose, UA: NEGATIVE
Ketones, UA: NEGATIVE
Leukocytes, UA: NEGATIVE
Nitrite, UA: NEGATIVE
Protein, UA: NEGATIVE

## 2018-01-05 NOTE — Progress Notes (Signed)
G1P0 6522w3d Estimated Date of Delivery: 03/06/18  Blood pressure 112/74, pulse 93, weight 150 lb (68 kg), last menstrual period 05/30/2017.   BP weight and urine results all reviewed and noted.  Please refer to the obstetrical flow sheet for the fundal height and fetal heart rate documentation:  Patient reports good fetal movement, denies any bleeding and no rupture of membranes symptoms or regular contractions. Patient is without complaints. All questions were answered.  Orders Placed This Encounter  Procedures  . POCT urinalysis dipstick    Plan:  Continued routine obstetrical care,   Return in about 2 weeks (around 01/19/2018) for LROB.

## 2018-01-19 ENCOUNTER — Ambulatory Visit (INDEPENDENT_AMBULATORY_CARE_PROVIDER_SITE_OTHER): Payer: Medicaid Other | Admitting: Obstetrics and Gynecology

## 2018-01-19 ENCOUNTER — Encounter: Payer: Self-pay | Admitting: Obstetrics and Gynecology

## 2018-01-19 VITALS — BP 120/70 | HR 91 | Wt 150.6 lb

## 2018-01-19 DIAGNOSIS — Z331 Pregnant state, incidental: Secondary | ICD-10-CM

## 2018-01-19 DIAGNOSIS — Z3403 Encounter for supervision of normal first pregnancy, third trimester: Secondary | ICD-10-CM

## 2018-01-19 DIAGNOSIS — Z1389 Encounter for screening for other disorder: Secondary | ICD-10-CM

## 2018-01-19 DIAGNOSIS — Z3A33 33 weeks gestation of pregnancy: Secondary | ICD-10-CM

## 2018-01-19 LAB — POCT URINALYSIS DIPSTICK
Blood, UA: NEGATIVE
Glucose, UA: NEGATIVE
Ketones, UA: NEGATIVE
Leukocytes, UA: NEGATIVE
Nitrite, UA: NEGATIVE
Protein, UA: NEGATIVE

## 2018-01-19 NOTE — Progress Notes (Signed)
Patient ID: Jennifer Obrien, female   DOB: January 09, 1996, 22 y.o.   MRN: [id number]   LOW-RISK PREGNANCY VISIT Patient name: Jennifer Obrien MRN [id number]  Date of birth: Nov 13, 1995 Chief Complaint:   Routine Prenatal Visit  History of Present Illness:   Jennifer Obrien is a 22 y.o. G1P0 female at [redacted]w[redacted]d with an Estimated Date of Delivery: 03/06/18 being seen today for ongoing management of a low-risk pregnancy, patient is not going to attend childbirth classes, despite encouragement.  Today she reports no complaints. Contractions: Not present. Vag. Bleeding: None.  Movement: Present. denies leaking of fluid. Review of Systems:   Pertinent items are noted in HPI Denies abnormal vaginal discharge w/ itching/odor/irritation, headaches, visual changes, shortness of breath, chest pain, abdominal pain, severe nausea/vomiting, or problems with urination or bowel movements unless otherwise stated above. Pertinent History Reviewed:  Reviewed past medical,surgical, social, obstetrical and family history.  Reviewed problem list, medications and allergies. Physical Assessment:   Vitals:   01/19/18 1408  BP: 120/70  Pulse: 91  Weight: 150 lb 9.6 oz (68.3 kg)  Body mass index is 25.85 kg/m.        Physical Examination:   General appearance: Well appearing, and in no distress  Mental status: Alert, oriented to person, place, and time  Skin: Warm & dry  Cardiovascular: Normal heart rate noted  Respiratory: Normal respiratory effort, no distress  Abdomen: Soft, gravid, nontender  Pelvic: Cervical exam deferred         Extremities: Edema: Trace  Fetal Status: Fetal heart 142 fundal height 33 cm  Results for orders placed or performed in visit on 01/19/18 (from the past 24 hour(s))  POCT urinalysis dipstick   Collection Time: 01/19/18  2:09 PM  Result Value Ref Range   Color, UA     Clarity, UA     Glucose, UA neg    Bilirubin, UA     Ketones, UA neg    Spec Grav, UA  1.010 - 1.025   Blood, UA  neg    pH, UA  5.0 - 8.0   Protein, UA neg    Urobilinogen, UA  0.2 or 1.0 E.U./dL   Nitrite, UA neg    Leukocytes, UA Negative Negative   Appearance     Odor      Assessment & Plan:  1) Low-risk pregnancy G1P0 at [redacted]w[redacted]d with an Estimated Date of Delivery: 03/06/18    Meds: No orders of the defined types were placed in this encounter.  Labs/procedures today:   Plan:  Continue routine obstetrical care   Follow-up: Return in about 2 weeks (around 02/02/2018) for LROB.  Orders Placed This Encounter  Procedures  . POCT urinalysis dipstick   By signing my name below, I, Diona Browner, attest that this documentation has been prepared under the direction and in the presence of Tilda Burrow, MD. Electronically Signed: Diona Browner, Medical Scribe. 01/19/18. 2:52 PM.  I personally performed the services described in this documentation, which was SCRIBED in my presence. The recorded information has been reviewed and considered accurate. It has been edited as necessary during review. Tilda Burrow, MD

## 2018-02-02 ENCOUNTER — Encounter: Payer: Self-pay | Admitting: Advanced Practice Midwife

## 2018-02-02 ENCOUNTER — Ambulatory Visit (INDEPENDENT_AMBULATORY_CARE_PROVIDER_SITE_OTHER): Payer: Medicaid Other | Admitting: Advanced Practice Midwife

## 2018-02-02 ENCOUNTER — Other Ambulatory Visit: Payer: Self-pay

## 2018-02-02 VITALS — BP 116/74 | HR 82 | Wt 152.0 lb

## 2018-02-02 DIAGNOSIS — Z3403 Encounter for supervision of normal first pregnancy, third trimester: Secondary | ICD-10-CM

## 2018-02-02 DIAGNOSIS — Z1389 Encounter for screening for other disorder: Secondary | ICD-10-CM

## 2018-02-02 DIAGNOSIS — Z331 Pregnant state, incidental: Secondary | ICD-10-CM

## 2018-02-02 DIAGNOSIS — Z3A35 35 weeks gestation of pregnancy: Secondary | ICD-10-CM

## 2018-02-02 LAB — POCT URINALYSIS DIPSTICK
Blood, UA: NEGATIVE
Glucose, UA: NEGATIVE
Ketones, UA: NEGATIVE
Leukocytes, UA: NEGATIVE
Nitrite, UA: NEGATIVE
Protein, UA: NEGATIVE

## 2018-02-02 NOTE — Patient Instructions (Signed)

## 2018-02-02 NOTE — Progress Notes (Signed)
  G1P0 [redacted]w[redacted]d Estimated Date of Delivery: 03/06/18  Blood pressure 116/74, pulse 82, weight 152 lb (68.9 kg), last menstrual period 05/30/2017.   BP weight and urine results all reviewed and noted.  Please refer to the obstetrical flow sheet for the fundal height and fetal heart rate documentation:  Patient reports good fetal movement, denies any bleeding and no rupture of membranes symptoms or regular contractions. Patient is without complaints. All questions were answered.   Physical Assessment:   Vitals:   02/02/18 1416  BP: 116/74  Pulse: 82  Weight: 152 lb (68.9 kg)  Body mass index is 26.09 kg/m.        Physical Examination:   General appearance: Well appearing, and in no distress  Mental status: Alert, oriented to person, place, and time  Skin: Warm & dry  Cardiovascular: Normal heart rate noted  Respiratory: Normal respiratory effort, no distress  Abdomen: Soft, gravid, nontender  Pelvic: Cervical exam deferred         Extremities: Edema: Trace  Fetal Status: Fetal Heart Rate (bpm): 150 Fundal Height: 35 cm Movement: Present    Results for orders placed or performed in visit on 02/02/18 (from the past 24 hour(s))  POCT urinalysis dipstick   Collection Time: 02/02/18  2:18 PM  Result Value Ref Range   Color, UA     Clarity, UA     Glucose, UA neg    Bilirubin, UA     Ketones, UA neg    Spec Grav, UA  1.010 - 1.025   Blood, UA neg    pH, UA  5.0 - 8.0   Protein, UA neg    Urobilinogen, UA  0.2 or 1.0 E.U./dL   Nitrite, UA neg    Leukocytes, UA Negative Negative   Appearance     Odor       Orders Placed This Encounter  Procedures  . POCT urinalysis dipstick    Plan:  Continued routine obstetrical care,   Return in about 1 week (around 02/09/2018) for LROB.

## 2018-02-09 ENCOUNTER — Ambulatory Visit (INDEPENDENT_AMBULATORY_CARE_PROVIDER_SITE_OTHER): Payer: Medicaid Other | Admitting: Obstetrics & Gynecology

## 2018-02-09 ENCOUNTER — Encounter: Payer: Self-pay | Admitting: Obstetrics & Gynecology

## 2018-02-09 ENCOUNTER — Other Ambulatory Visit: Payer: Self-pay

## 2018-02-09 VITALS — BP 118/70 | HR 102 | Wt 154.0 lb

## 2018-02-09 DIAGNOSIS — Z3A36 36 weeks gestation of pregnancy: Secondary | ICD-10-CM

## 2018-02-09 DIAGNOSIS — Z331 Pregnant state, incidental: Secondary | ICD-10-CM

## 2018-02-09 DIAGNOSIS — Z3403 Encounter for supervision of normal first pregnancy, third trimester: Secondary | ICD-10-CM

## 2018-02-09 DIAGNOSIS — Z1389 Encounter for screening for other disorder: Secondary | ICD-10-CM

## 2018-02-09 LAB — POCT URINALYSIS DIPSTICK
Blood, UA: NEGATIVE
Glucose, UA: NEGATIVE
Ketones, UA: NEGATIVE
Leukocytes, UA: NEGATIVE
Nitrite, UA: NEGATIVE
Protein, UA: NEGATIVE

## 2018-02-09 NOTE — Progress Notes (Signed)
G1P0 [redacted]w[redacted]d Estimated Date of Delivery: 03/06/18  Blood pressure 118/70, pulse (!) 102, weight 154 lb (69.9 kg), last menstrual period 05/30/2017.   BP weight and urine results all reviewed and noted.  Please refer to the obstetrical flow sheet for the fundal height and fetal heart rate documentation:  Patient reports good fetal movement, denies any bleeding and no rupture of membranes symptoms or regular contractions. Patient is without complaints. All questions were answered.  Orders Placed This Encounter  Procedures  . POCT urinalysis dipstick    Plan:  Continued routine obstetrical care,   Return in about 1 week (around 02/16/2018) for LROB.

## 2018-02-16 ENCOUNTER — Ambulatory Visit (INDEPENDENT_AMBULATORY_CARE_PROVIDER_SITE_OTHER): Payer: Medicaid Other | Admitting: Obstetrics and Gynecology

## 2018-02-16 ENCOUNTER — Encounter: Payer: Self-pay | Admitting: Obstetrics and Gynecology

## 2018-02-16 VITALS — BP 110/80 | HR 100 | Wt 154.0 lb

## 2018-02-16 DIAGNOSIS — Z3483 Encounter for supervision of other normal pregnancy, third trimester: Secondary | ICD-10-CM

## 2018-02-16 DIAGNOSIS — Z1389 Encounter for screening for other disorder: Secondary | ICD-10-CM

## 2018-02-16 DIAGNOSIS — Z3A37 37 weeks gestation of pregnancy: Secondary | ICD-10-CM

## 2018-02-16 DIAGNOSIS — O9989 Other specified diseases and conditions complicating pregnancy, childbirth and the puerperium: Secondary | ICD-10-CM

## 2018-02-16 DIAGNOSIS — Z3403 Encounter for supervision of normal first pregnancy, third trimester: Secondary | ICD-10-CM

## 2018-02-16 DIAGNOSIS — Z331 Pregnant state, incidental: Secondary | ICD-10-CM

## 2018-02-16 DIAGNOSIS — M545 Low back pain: Secondary | ICD-10-CM

## 2018-02-16 LAB — POCT URINALYSIS DIPSTICK
Blood, UA: NEGATIVE
Glucose, UA: NEGATIVE
Ketones, UA: NEGATIVE
Leukocytes, UA: NEGATIVE
Nitrite, UA: NEGATIVE
Protein, UA: NEGATIVE

## 2018-02-16 NOTE — Patient Instructions (Signed)
(  [phone] is the phone number for Pregnancy Classes or hospital tours at [facility].  ° °You will be referred to  [email or web address]   for more information on childbirth classes   °At this site you may register for classes. You may sign up for a waiting list if classes are full. Please SIGN UP FOR THIS!.   When the waiting list becomes long, sometimes new classes can be added. ° ° ° °

## 2018-02-16 NOTE — Progress Notes (Signed)
Patient ID: Jennifer Obrien, female   DOB: 1996/02/15, 22 y.o.   MRN: [id number]   LOW-RISK PREGNANCY VISIT Patient name: Jennifer Obrien MRN [id number]  Date of birth: 10/29/1995 Chief Complaint:   low risk ob (gbs-gc-chl)  History of Present Illness:   Kalifa Cadden is a 22 y.o. G1P0 female at [redacted]w[redacted]d with an Estimated Date of Delivery: 03/06/18 being seen today for ongoing management of a low-risk pregnancy.  Today she reports occasional sharp, lower back pain. She has been doing some research on what to expect, but she has not done any child birthing classes. The baby's father is in the picture.   Contractions: Not present.  .  Movement: Present. denies leaking of fluid. Review of Systems:   Pertinent items are noted in HPI Denies abnormal vaginal discharge w/ itching/odor/irritation, headaches, visual changes, shortness of breath, chest pain, abdominal pain, severe nausea/vomiting, or problems with urination or bowel movements unless otherwise stated above. Pertinent History Reviewed:  Reviewed past medical,surgical, social, obstetrical and family history.  Reviewed problem list, medications and allergies. Physical Assessment:   Vitals:   02/16/18 1414  BP: 110/80  Pulse: 100  Weight: 154 lb (69.9 kg)  Body mass index is 26.43 kg/m.        Physical Examination:   General appearance: Well appearing, and in no distress  Mental status: Alert, oriented to person, place, and time  Skin: Warm & dry  Cardiovascular: Normal heart rate noted  Respiratory: Normal respiratory effort, no distress  Abdomen: Soft, gravid, nontender  Pelvic: cervix is closed, posterior and long         Extremities: Edema: Trace  Fetal Status:     Movement: Present    Results for orders placed or performed in visit on 02/16/18 (from the past 24 hour(s))  POCT urinalysis dipstick   Collection Time: 02/16/18  2:23 PM  Result Value Ref Range   Color, UA     Clarity, UA     Glucose, UA Negative Negative   Bilirubin, UA     Ketones, UA neg    Spec Grav, UA  1.010 - 1.025   Blood, UA neg    pH, UA  5.0 - 8.0   Protein, UA Negative Negative   Urobilinogen, UA  0.2 or 1.0 E.U./dL   Nitrite, UA neg    Leukocytes, UA Negative Negative   Appearance     Odor      Assessment & Plan:  1) Low-risk pregnancy G1P0 at [redacted]w[redacted]d with an Estimated Date of Delivery: 03/06/18    Meds: No orders of the defined types were placed in this encounter.  Labs/procedures today: Group B, GC/Chlamydia  Plan:  Continue routine obstetrical care  Reviewed: Term labor symptoms and general obstetric precautions including but not limited to vaginal bleeding, contractions, leaking of fluid and fetal movement were reviewed in detail with the patient.  All questions were answered  Follow-up: Return in about 1 week (around 02/23/2018) for LROB.  Orders Placed This Encounter  Procedures  . Strep Gp B NAA  . GC/Chlamydia Probe Amp  . POCT urinalysis dipstick   By signing my name below, I, Diona Browner, attest that this documentation has been prepared under the direction and in the presence of Tilda Burrow, MD. Electronically Signed: Diona Browner, Medical Scribe. 02/16/18. 2:40 PM.  I personally performed the services described in this documentation, which was SCRIBED in my presence. The recorded information has been reviewed and considered accurate. It has been edited as necessary  during review. Jonnie Kind, MD

## 2018-02-18 LAB — STREP GP B NAA: Strep Gp B NAA: NEGATIVE

## 2018-02-18 LAB — GC/CHLAMYDIA PROBE AMP
Chlamydia trachomatis, NAA: NEGATIVE
Neisseria gonorrhoeae by PCR: NEGATIVE

## 2018-02-23 ENCOUNTER — Ambulatory Visit (INDEPENDENT_AMBULATORY_CARE_PROVIDER_SITE_OTHER): Payer: Medicaid Other | Admitting: Obstetrics & Gynecology

## 2018-02-23 ENCOUNTER — Encounter: Payer: Self-pay | Admitting: Obstetrics & Gynecology

## 2018-02-23 VITALS — BP 125/83 | HR 94 | Wt 152.0 lb

## 2018-02-23 DIAGNOSIS — Z1389 Encounter for screening for other disorder: Secondary | ICD-10-CM

## 2018-02-23 DIAGNOSIS — Z331 Pregnant state, incidental: Secondary | ICD-10-CM

## 2018-02-23 DIAGNOSIS — Z3403 Encounter for supervision of normal first pregnancy, third trimester: Secondary | ICD-10-CM

## 2018-02-23 DIAGNOSIS — Z3A38 38 weeks gestation of pregnancy: Secondary | ICD-10-CM

## 2018-02-23 LAB — POCT URINALYSIS DIPSTICK
Blood, UA: NEGATIVE
Glucose, UA: NEGATIVE
Leukocytes, UA: NEGATIVE
Nitrite, UA: NEGATIVE
Protein, UA: NEGATIVE

## 2018-02-23 NOTE — Progress Notes (Signed)
G1P0 4780w3d Estimated Date of Delivery: 03/06/18  Blood pressure 125/83, pulse 94, weight 152 lb (68.9 kg), last menstrual period 05/30/2017.   BP weight and urine results all reviewed and noted.  Please refer to the obstetrical flow sheet for the fundal height and fetal heart rate documentation:  Patient reports good fetal movement, denies any bleeding and no rupture of membranes symptoms or regular contractions. Patient is without complaints. All questions were answered.  Orders Placed This Encounter  Procedures  . POCT Urinalysis Dipstick    Plan:  Continued routine obstetrical care, thin vaginal septum at the introitus only, very pliable  CX  TFT/th/-3/post/soft  Return in about 1 week (around 03/02/2018) for LROB.

## 2018-03-02 ENCOUNTER — Ambulatory Visit (INDEPENDENT_AMBULATORY_CARE_PROVIDER_SITE_OTHER): Payer: Medicaid Other | Admitting: Advanced Practice Midwife

## 2018-03-02 ENCOUNTER — Encounter: Payer: Self-pay | Admitting: Advanced Practice Midwife

## 2018-03-02 VITALS — BP 113/67 | HR 92 | Wt 160.0 lb

## 2018-03-02 DIAGNOSIS — Z3403 Encounter for supervision of normal first pregnancy, third trimester: Secondary | ICD-10-CM | POA: Diagnosis not present

## 2018-03-02 DIAGNOSIS — Z3A39 39 weeks gestation of pregnancy: Secondary | ICD-10-CM

## 2018-03-02 DIAGNOSIS — Z331 Pregnant state, incidental: Secondary | ICD-10-CM

## 2018-03-02 DIAGNOSIS — Z1389 Encounter for screening for other disorder: Secondary | ICD-10-CM

## 2018-03-02 LAB — POCT URINALYSIS DIPSTICK
Blood, UA: NEGATIVE
Glucose, UA: NEGATIVE
Ketones, UA: NEGATIVE
Leukocytes, UA: NEGATIVE
Nitrite, UA: NEGATIVE
Protein, UA: NEGATIVE

## 2018-03-02 NOTE — Patient Instructions (Signed)

## 2018-03-02 NOTE — Progress Notes (Signed)
  G1P0 3578w3d Estimated Date of Delivery: 03/06/18  Blood pressure 113/67, pulse 92, weight 160 lb (72.6 kg), last menstrual period 05/30/2017.   BP weight and urine results all reviewed and noted.  Please refer to the obstetrical flow sheet for the fundal height and fetal heart rate documentation:  Patient reports good fetal movement, denies any bleeding and no rupture of membranes symptoms or regular contractions. Patient is without complaints. All questions were answered.   Physical Assessment:   Vitals:   03/02/18 1421  BP: 113/67  Pulse: 92  Weight: 160 lb (72.6 kg)  Body mass index is 27.46 kg/m.        Physical Examination:   General appearance: Well appearing, and in no distress  Mental status: Alert, oriented to person, place, and time  Skin: Warm & dry  Cardiovascular: Normal heart rate noted  Respiratory: Normal respiratory effort, no distress  Abdomen: Soft, gravid, nontender  Pelvic: Cervical exam deferred         Extremities: Edema: Trace  Fetal Status:     Movement: Present    Results for orders placed or performed in visit on 03/02/18 (from the past 24 hour(s))  POCT urinalysis dipstick   Collection Time: 03/02/18  2:22 PM  Result Value Ref Range   Color, UA     Clarity, UA     Glucose, UA Negative Negative   Bilirubin, UA     Ketones, UA neg    Spec Grav, UA  1.010 - 1.025   Blood, UA neg    pH, UA  5.0 - 8.0   Protein, UA Negative Negative   Urobilinogen, UA  0.2 or 1.0 E.U./dL   Nitrite, UA neg    Leukocytes, UA Negative Negative   Appearance     Odor       Orders Placed This Encounter  Procedures  . POCT urinalysis dipstick    Plan:  Continued routine obstetrical care,   Return in about 1 week (around 03/09/2018) for LROB, NST.

## 2018-03-05 ENCOUNTER — Inpatient Hospital Stay (HOSPITAL_COMMUNITY)
Admission: AD | Admit: 2018-03-05 | Discharge: 2018-03-08 | DRG: 807 | Disposition: A | Discharge status: Home / Self Care | Payer: Medicaid Other | Attending: Obstetrics and Gynecology | Admitting: Obstetrics and Gynecology

## 2018-03-05 ENCOUNTER — Encounter (HOSPITAL_COMMUNITY): Payer: Self-pay | Admitting: *Deleted

## 2018-03-05 DIAGNOSIS — Z3A40 40 weeks gestation of pregnancy: Secondary | ICD-10-CM

## 2018-03-05 DIAGNOSIS — Z87891 Personal history of nicotine dependence: Secondary | ICD-10-CM

## 2018-03-05 DIAGNOSIS — O9952 Diseases of the respiratory system complicating childbirth: Secondary | ICD-10-CM | POA: Diagnosis present

## 2018-03-05 DIAGNOSIS — A6000 Herpesviral infection of urogenital system, unspecified: Secondary | ICD-10-CM | POA: Diagnosis present

## 2018-03-05 DIAGNOSIS — O9832 Other infections with a predominantly sexual mode of transmission complicating childbirth: Principal | ICD-10-CM | POA: Diagnosis present

## 2018-03-05 DIAGNOSIS — O322XX0 Maternal care for transverse and oblique lie, not applicable or unspecified: Secondary | ICD-10-CM | POA: Diagnosis present

## 2018-03-05 DIAGNOSIS — J45909 Unspecified asthma, uncomplicated: Secondary | ICD-10-CM | POA: Diagnosis present

## 2018-03-05 DIAGNOSIS — O3463 Maternal care for abnormality of vagina, third trimester: Secondary | ICD-10-CM | POA: Diagnosis present

## 2018-03-05 DIAGNOSIS — Z3403 Encounter for supervision of normal first pregnancy, third trimester: Secondary | ICD-10-CM

## 2018-03-05 DIAGNOSIS — Q5210 Doubling of vagina, unspecified: Secondary | ICD-10-CM

## 2018-03-05 MED ORDER — BUTORPHANOL TARTRATE 1 MG/ML IJ SOLN
1.0000 mg | Freq: Once | INTRAMUSCULAR | Status: AC
  Administered 2018-03-05: 1 mg via INTRAMUSCULAR
  Filled 2018-03-05: qty 1

## 2018-03-05 NOTE — MAU Note (Addendum)
Pt reports uc's for 3-4 hours. + Fm , denies LOF or bleeding

## 2018-03-05 NOTE — Progress Notes (Signed)
Pt to ambulate for an hour and be rechecked for cervical change

## 2018-03-06 ENCOUNTER — Telehealth: Payer: Self-pay | Admitting: *Deleted

## 2018-03-06 ENCOUNTER — Inpatient Hospital Stay (HOSPITAL_COMMUNITY): Payer: Medicaid Other | Admitting: Anesthesiology

## 2018-03-06 ENCOUNTER — Other Ambulatory Visit: Payer: Self-pay

## 2018-03-06 ENCOUNTER — Encounter (HOSPITAL_COMMUNITY): Payer: Self-pay

## 2018-03-06 DIAGNOSIS — J45909 Unspecified asthma, uncomplicated: Secondary | ICD-10-CM | POA: Diagnosis present

## 2018-03-06 DIAGNOSIS — Q5210 Doubling of vagina, unspecified: Secondary | ICD-10-CM | POA: Diagnosis not present

## 2018-03-06 DIAGNOSIS — Z3A40 40 weeks gestation of pregnancy: Secondary | ICD-10-CM

## 2018-03-06 DIAGNOSIS — O322XX0 Maternal care for transverse and oblique lie, not applicable or unspecified: Secondary | ICD-10-CM | POA: Diagnosis present

## 2018-03-06 DIAGNOSIS — Z87891 Personal history of nicotine dependence: Secondary | ICD-10-CM | POA: Diagnosis not present

## 2018-03-06 DIAGNOSIS — O9832 Other infections with a predominantly sexual mode of transmission complicating childbirth: Secondary | ICD-10-CM | POA: Diagnosis present

## 2018-03-06 DIAGNOSIS — Z3483 Encounter for supervision of other normal pregnancy, third trimester: Secondary | ICD-10-CM | POA: Diagnosis present

## 2018-03-06 DIAGNOSIS — O9952 Diseases of the respiratory system complicating childbirth: Secondary | ICD-10-CM | POA: Diagnosis present

## 2018-03-06 DIAGNOSIS — O3463 Maternal care for abnormality of vagina, third trimester: Secondary | ICD-10-CM | POA: Diagnosis present

## 2018-03-06 DIAGNOSIS — A6000 Herpesviral infection of urogenital system, unspecified: Secondary | ICD-10-CM | POA: Diagnosis present

## 2018-03-06 LAB — CBC
HCT: 30.9 % — ABNORMAL LOW (ref 36.0–46.0)
Hemoglobin: 10.3 g/dL — ABNORMAL LOW (ref 12.0–15.0)
MCH: 31.3 pg (ref 26.0–34.0)
MCHC: 33.3 g/dL (ref 30.0–36.0)
MCV: 93.9 fL (ref 78.0–100.0)
Platelets: 233 10*3/uL (ref 150–400)
RBC: 3.29 MIL/uL — ABNORMAL LOW (ref 3.87–5.11)
RDW: 13.7 % (ref 11.5–15.5)
WBC: 14.2 10*3/uL — ABNORMAL HIGH (ref 4.0–10.5)

## 2018-03-06 LAB — ABO/RH: ABO/RH(D): O POS

## 2018-03-06 LAB — TYPE AND SCREEN
ABO/RH(D): O POS
Antibody Screen: NEGATIVE

## 2018-03-06 LAB — RPR: RPR Ser Ql: NONREACTIVE

## 2018-03-06 MED ORDER — OXYTOCIN BOLUS FROM INFUSION
500.0000 mL | Freq: Once | INTRAVENOUS | Status: AC
  Administered 2018-03-06: 500 mL via INTRAVENOUS

## 2018-03-06 MED ORDER — LACTATED RINGERS IV SOLN: 500.0000 mL | Freq: Once | INTRAVENOUS | Status: DC

## 2018-03-06 MED ORDER — LACTATED RINGERS IV SOLN
INTRAVENOUS | Status: DC
  Administered 2018-03-06 (×2): via INTRAVENOUS

## 2018-03-06 MED ORDER — ACETAMINOPHEN 325 MG PO TABS: 650.0000 mg | ORAL_TABLET | ORAL | Status: DC | PRN

## 2018-03-06 MED ORDER — EPHEDRINE 5 MG/ML INJ
10.0000 mg | INTRAVENOUS | Status: DC | PRN
  Filled 2018-03-06: qty 2

## 2018-03-06 MED ORDER — PHENYLEPHRINE 40 MCG/ML (10ML) SYRINGE FOR IV PUSH (FOR BLOOD PRESSURE SUPPORT)
80.0000 ug | PREFILLED_SYRINGE | INTRAVENOUS | Status: DC | PRN
  Filled 2018-03-06: qty 5
  Filled 2018-03-06: qty 10

## 2018-03-06 MED ORDER — COCONUT OIL OIL: 1.0000 "application " | TOPICAL_OIL | Status: DC | PRN

## 2018-03-06 MED ORDER — ACETAMINOPHEN 325 MG PO TABS
650.0000 mg | ORAL_TABLET | ORAL | Status: DC | PRN
  Administered 2018-03-07: 650 mg via ORAL
  Filled 2018-03-06: qty 2

## 2018-03-06 MED ORDER — SOD CITRATE-CITRIC ACID 500-334 MG/5ML PO SOLN: 30.0000 mL | ORAL | Status: DC | PRN

## 2018-03-06 MED ORDER — FENTANYL 2.5 MCG/ML BUPIVACAINE 1/10 % EPIDURAL INFUSION (WH - ANES)
14.0000 mL/h | INTRAMUSCULAR | Status: DC | PRN
  Administered 2018-03-06 (×2): 14 mL/h via EPIDURAL
  Filled 2018-03-06 (×2): qty 100

## 2018-03-06 MED ORDER — BENZOCAINE-MENTHOL 20-0.5 % EX AERO
1.0000 "application " | INHALATION_SPRAY | CUTANEOUS | Status: DC | PRN
  Filled 2018-03-06: qty 56

## 2018-03-06 MED ORDER — SENNOSIDES-DOCUSATE SODIUM 8.6-50 MG PO TABS
2.0000 | ORAL_TABLET | ORAL | Status: DC
  Administered 2018-03-06 – 2018-03-07 (×2): 2 via ORAL
  Filled 2018-03-06 (×2): qty 2

## 2018-03-06 MED ORDER — OXYCODONE-ACETAMINOPHEN 5-325 MG PO TABS: 1.0000 | ORAL_TABLET | ORAL | Status: DC | PRN

## 2018-03-06 MED ORDER — FENTANYL CITRATE (PF) 100 MCG/2ML IJ SOLN: 100.0000 ug | INTRAMUSCULAR | Status: DC | PRN

## 2018-03-06 MED ORDER — PHENYLEPHRINE 40 MCG/ML (10ML) SYRINGE FOR IV PUSH (FOR BLOOD PRESSURE SUPPORT): 80.0000 ug | PREFILLED_SYRINGE | INTRAVENOUS | Status: DC | PRN

## 2018-03-06 MED ORDER — ONDANSETRON HCL 4 MG/2ML IJ SOLN: 4.0000 mg | Freq: Four times a day (QID) | INTRAMUSCULAR | Status: DC | PRN

## 2018-03-06 MED ORDER — WITCH HAZEL-GLYCERIN EX PADS: 1.0000 "application " | MEDICATED_PAD | CUTANEOUS | Status: DC | PRN

## 2018-03-06 MED ORDER — OXYTOCIN 40 UNITS IN LACTATED RINGERS INFUSION - SIMPLE MED
2.5000 [IU]/h | INTRAVENOUS | Status: DC
  Filled 2018-03-06: qty 1000

## 2018-03-06 MED ORDER — ONDANSETRON HCL 4 MG PO TABS: 4.0000 mg | ORAL_TABLET | ORAL | Status: DC | PRN

## 2018-03-06 MED ORDER — LIDOCAINE HCL (PF) 1 % IJ SOLN
INTRAMUSCULAR | Status: DC | PRN
  Administered 2018-03-06 (×2): 5 mL via EPIDURAL

## 2018-03-06 MED ORDER — EPHEDRINE 5 MG/ML INJ: 10.0000 mg | INTRAVENOUS | Status: DC | PRN

## 2018-03-06 MED ORDER — LACTATED RINGERS IV SOLN: 500.0000 mL | INTRAVENOUS | Status: DC | PRN

## 2018-03-06 MED ORDER — TETANUS-DIPHTH-ACELL PERTUSSIS 5-2.5-18.5 LF-MCG/0.5 IM SUSP: 0.5000 mL | Freq: Once | INTRAMUSCULAR | Status: DC

## 2018-03-06 MED ORDER — LACTATED RINGERS IV SOLN
500.0000 mL | Freq: Once | INTRAVENOUS | Status: AC
  Administered 2018-03-06: 500 mL via INTRAVENOUS

## 2018-03-06 MED ORDER — PRENATAL MULTIVITAMIN CH
1.0000 | ORAL_TABLET | Freq: Every day | ORAL | Status: DC
  Administered 2018-03-06 – 2018-03-07 (×2): 1 via ORAL
  Filled 2018-03-06 (×2): qty 1

## 2018-03-06 MED ORDER — LIDOCAINE HCL (PF) 1 % IJ SOLN
30.0000 mL | INTRAMUSCULAR | Status: DC | PRN
  Filled 2018-03-06: qty 30

## 2018-03-06 MED ORDER — ZOLPIDEM TARTRATE 5 MG PO TABS: 5.0000 mg | ORAL_TABLET | Freq: Every evening | ORAL | Status: DC | PRN

## 2018-03-06 MED ORDER — PHENYLEPHRINE 40 MCG/ML (10ML) SYRINGE FOR IV PUSH (FOR BLOOD PRESSURE SUPPORT)
80.0000 ug | PREFILLED_SYRINGE | INTRAVENOUS | Status: DC | PRN
  Filled 2018-03-06: qty 5

## 2018-03-06 MED ORDER — ONDANSETRON HCL 4 MG/2ML IJ SOLN: 4.0000 mg | INTRAMUSCULAR | Status: DC | PRN

## 2018-03-06 MED ORDER — DIPHENHYDRAMINE HCL 25 MG PO CAPS: 25.0000 mg | ORAL_CAPSULE | Freq: Four times a day (QID) | ORAL | Status: DC | PRN

## 2018-03-06 MED ORDER — DIPHENHYDRAMINE HCL 50 MG/ML IJ SOLN: 12.5000 mg | INTRAMUSCULAR | Status: DC | PRN

## 2018-03-06 MED ORDER — SIMETHICONE 80 MG PO CHEW: 80.0000 mg | CHEWABLE_TABLET | ORAL | Status: DC | PRN

## 2018-03-06 MED ORDER — DIBUCAINE 1 % RE OINT: 1.0000 "application " | TOPICAL_OINTMENT | RECTAL | Status: DC | PRN

## 2018-03-06 MED ORDER — IBUPROFEN 600 MG PO TABS
600.0000 mg | ORAL_TABLET | Freq: Four times a day (QID) | ORAL | Status: DC
  Administered 2018-03-06 – 2018-03-08 (×8): 600 mg via ORAL
  Filled 2018-03-06 (×8): qty 1

## 2018-03-06 MED ORDER — OXYCODONE-ACETAMINOPHEN 5-325 MG PO TABS: 2.0000 | ORAL_TABLET | ORAL | Status: DC | PRN

## 2018-03-06 NOTE — H&P (Addendum)
LABOR ADMISSION HISTORY AND PHYSICAL  Ninamarie Keel is a 22 y.o. female G1P0 with IUP at [redacted]w[redacted]d by LMP presenting for SOL. She reports +FMs, No LOF, no VB, no blurry vision, headaches or peripheral edema, and RUQ pain.  She plans on breast feeding. She request POP for birth control.  Dating: By LMP --->  Estimated Date of Delivery: 03/06/18  Prenatal History/Complications: [facility] Office: FT Patient Active Problem List   Diagnosis Date Noted  . Normal labor 03/06/2018  . 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    Past Medical History: Past Medical History:  Diagnosis Date  . Anxiety    pt doesn't feel like she suffers from anxiety, was told it was asthma related.   . Asthma   . HSV-1 (herpes simplex virus 1) infection     Past Surgical History: Past Surgical History:  Procedure Laterality Date  . SPLENECTOMY, PARTIAL     cyst on spleen    Obstetrical History: OB History    Gravida  1   Para      Term      Preterm      AB      Living        SAB      TAB      Ectopic      Multiple      Live Births              Social History: Social History   Socioeconomic History  . Marital status: Single    Spouse name: Not on file  . Number of children: Not on file  . Years of education: Not on file  . Highest education level: Not on file  Occupational History  . Not on file  Social Needs  . Financial resource strain: Not on file  . Food insecurity:    Worry: Not on file    Inability: Not on file  . Transportation needs:    Medical: Not on file    Non-medical: Not on file  Tobacco Use  . Smoking status: Former Games developer  . Smokeless tobacco: Never Used  Substance and Sexual Activity  . Alcohol use: No    Frequency: Never  . Drug use: No  . Sexual activity: Yes    Birth control/protection: None  Lifestyle  . Physical activity:    Days per week: Not on  file    Minutes per session: Not on file  . Stress: Not on file  Relationships  . Social connections:    Talks on phone: Not on file    Gets together: Not on file    Attends religious service: Not on file    Active member of club or organization: Not on file    Attends meetings of clubs or organizations: Not on file    Relationship status: Not on file  Other Topics Concern  . Not on file  Social History Narrative  . Not on file    Family History: Family History  Problem Relation Age of Onset  . Hypertension Mother   . Hernia Father   . Diabetes Maternal Uncle   . Hypertension Maternal Grandmother   . Alcohol abuse Maternal Grandfather   . Hypertension Paternal Grandmother     Allergies: Allergies  Allergen Reactions  . Bee Venom     Medications Prior to Admission  Medication Sig Dispense Refill Last Dose  . albuterol (PROVENTIL  HFA;VENTOLIN HFA) 108 (90 Base) MCG/ACT inhaler Inhale into the lungs.   Past Month at Unknown time  . Prenatal Vit-Fe Fumarate-FA (MULTIVITAMIN-PRENATAL) 27-0.8 MG TABS tablet Take 1 tablet by mouth daily at 12 noon.   Past Week at Unknown time  . valACYclovir (VALTREX) 1000 MG tablet Take 2 tablets (2,000 mg total) by mouth 2 (two) times daily. X 1 day 4 tablet 3 Past Month at Unknown time  . ondansetron (ZOFRAN-ODT) 4 MG disintegrating tablet   0 More than a month at Unknown time    Review of Systems   All systems reviewed and negative except as stated in HPI  Physical Exam Blood pressure (!) 109/47, pulse (!) 110, temperature 98 F (36.7 C), height 5\' 4"  (1.626 m), weight 159 lb (72.1 kg), last menstrual period 05/30/2017. General appearance: alert, cooperative and no distress HEENT: No JVD, MMM Abdomen: soft, non-tender; bowel sounds normal Extremities: Homans sign is negative, no sign of DVT Presentation: cephalic Fetal monitoringBaseline: 140 bpm, Variability: Good {> 6 bpm), Accelerations: Reactive and Decelerations:  Absent Uterine activityFrequency: Every 3-6 minutes Dilation: 4 Effacement (%): 90 Station: -2 Exam by:: B Mosca RN   Prenatal labs: ABO, Rh: O/Positive/-- (12/04 1535) Antibody: Negative (03/21 0909) Rubella: 4.56 (12/04 1535) RPR: Non Reactive (03/21 0909)  HBsAg: Negative (12/04 1535)  HIV: Non Reactive (03/21 0909)  GBS: Negative (05/30 1600)  2 hr Glucola wnl  Prenatal Transfer Tool  Maternal Diabetes: No Genetic Screening: Declined Maternal Ultrasounds/Referrals: Normal Fetal Ultrasounds or other Referrals:  None Maternal Substance Abuse:  No Significant Maternal Medications:  None Significant Maternal Lab Results: None  No results found for this or any previous visit (from the past 24 hour(s)).  Patient Active Problem List   Diagnosis Date Noted  . Normal labor 03/06/2018  . 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 Supervision of normal first pregnancy 08/23/2017    Assessment: Donita BrooksDakota Hatchell is a 22 y.o. G1P0 at 6974w0d here for SOL  #Labor: Expectant management for now #Pain: Epidural #FWB: Cat I #ID:  GBS neg #MOF: Breast #MOC:POP #Circ:  N/A  Thomes DinningBrad Thompson, MD, MS FAMILY MEDICINE RESIDENT - PGY1 03/06/2018 12:56 AM  OB FELLOW HISTORY AND PHYSICAL ATTESTATION I have seen and examined this patient; I agree with above documentation in the resident's note.   22 y.o. G1P0 with IUP at 1374w0d here for SOL. Expectant management for now GBS neg FHT Cat I  Frederik PearJulie P Krystopher Kuenzel, MD OB Fellow 03/06/2018, 1:29 AM

## 2018-03-06 NOTE — Progress Notes (Signed)
SVE 9.5, bulging bag. Of note, pt with very thin vaginal septum at the introitus.  FHT Cat I (135, mod variability, +acel, no decel  Pt decline AROM at this time. Expectant management for now. Pt aware that of septum, and that will likely let it naturally tear at time of delivery given than it is very thin, but can cut if needed at time of delivery.   Raynelle FanningJulie P. Degele, MD OB Fellow

## 2018-03-06 NOTE — Lactation Note (Signed)
This note was copied from a baby's chart. Lactation Consultation Note  Patient Name: Jennifer Obrien Today's Date: 03/06/2018 Reason for consult: Initial assessment;1st time breastfeeding;Primapara;Term;Difficult latch  P1 mother whose infant is now 913 hours old.    Mother and grandmother in room with baby.  Mother is frustrated and grandmother states that baby is hungry but will not feed at the breast.  Offered to assist with latch and mother reluctantly agreed.  Mother has short shafted nipples bilaterally.  Since she is frustrated and reluctant to attempt feeding again I reviewed only the basics of breastfeeding with her.  Attempted to latch baby in the football hold on the right breast.  Baby did open wide but did not suck at the breast.  She was restless and did not want to suck.  According to grandmother, "she is hungry."  Burped baby and offered a second posiiton to try and mother agreed.  This time I assisted with latching in the cross cradle hold on the left breast.  Baby seemed to enjoy this hold better but still required multiple attempts to latch.  She was able to suck a few times repeatedly.  I continued to encourage mother to keep putting her to breast when she self released.  With my help baby did suck on/off for about 10 minutes and needed stimulation to continue sucking.  Showed mother techniques to keep her awake at the breast.  Encouraged feeding 8-12 times/24 hours or earlier if baby shows cues, STS, breast massage and hand expression.  Mom made aware of O/P services, breastfeeding support groups, community resources, and our phone # for post-discharge questions.  Mother is wanting the bath done now but will call for assistance as needed.  Encourage her to keep practicing and to watch for cues.  Reminded her that baby will improve with practice.  Grandmother present and supportive.   Maternal Data Formula Feeding for Exclusion: No Has patient been taught Hand Expression?:  Yes Does the patient have breastfeeding experience prior to this delivery?: No  Feeding Feeding Type: Breast Fed Length of feed: 15 min(on and off)  LATCH Score Latch: Repeated attempts needed to sustain latch, nipple held in mouth throughout feeding, stimulation needed to elicit sucking reflex.  Audible Swallowing: A few with stimulation  Type of Nipple: Everted at rest and after stimulation(short shafted bilaterally)  Comfort (Breast/Nipple): Soft / non-tender  Hold (Positioning): Assistance needed to correctly position infant at breast and maintain latch.  LATCH Score: 7  Interventions Interventions: Breast feeding basics reviewed;Assisted with latch;Skin to skin;Breast massage;Hand express;Position options;Support pillows;Adjust position;Breast compression  Lactation Tools Discussed/Used Tools: Nipple Shields Nipple shield size: 20   Consult Status Consult Status: Follow-up Date: 03/07/18 Follow-up type: In-patient    Morse Brueggemann R Jennifer Obrien 03/06/2018, 11:21 PM

## 2018-03-06 NOTE — Anesthesia Pain Management Evaluation Note (Signed)
  CRNA Pain Management Visit Note  Patient: Jennifer Obrien, 22 y.o., female  "Hello I am a [facility]. We have an anesthesia team available at all times to provide care throughout the hospital, including epidural management and anesthesia for C-section. I don't know your plan for the delivery whether it a natural birth, water birth, IV sedation, nitrous supplementation, doula or epidural, but we want to meet your pain goals."   1.Was your pain managed to your expectations on prior hospitalizations?   No prior hospitalizations  2.What is your expectation for pain management during this hospitalization?     Epidural  3.How can we help you reach that goal? unsure  Record the patient's initial score and the patient's pain goal.   Pain: 0  Pain Goal: 10 [facility] wants you to be able to say your pain was always managed very well.  Cephus ShellingBURGER,Jennifer Obrien 03/06/2018

## 2018-03-06 NOTE — Progress Notes (Signed)
LABOR PROGRESS NOTE  Jennifer BrooksDakota Obrien is a 22 y.o. G1P0 at 5230w0d  admitted for SOL  Subjective: Pt is comfortable w/ epidural  Objective: BP (!) 105/55   Pulse 95   Temp 97.9 F (36.6 C) (Oral)   Resp 18   Ht 5\' 4"  (1.626 m)   Wt 159 lb (72.1 kg)   LMP 05/30/2017 (Exact Date)   SpO2 98%   BMI 27.29 kg/m  or  Vitals:   03/06/18 0330 03/06/18 0400 03/06/18 0430 03/06/18 0500  BP: (!) 104/55 108/67 108/68 (!) 105/55  Pulse: (!) 126 (!) 104 (!) 106 95  Resp: 18 18 18 18   Temp:      TempSrc:      SpO2:      Weight:      Height:         Dilation: 9 Effacement (%): 100 Cervical Position: Middle Station: -1 Presentation: Vertex Exam by:: Delanna Ahmadilare Fisher RN FHT: 140, mod var, reactive accel, no decle Uterine activity: 2-4 min  Labs: Lab Results  Component Value Date   WBC 14.2 (H) 03/06/2018   HGB 10.3 (L) 03/06/2018   HCT 30.9 (L) 03/06/2018   MCV 93.9 03/06/2018   PLT 233 03/06/2018    Patient Active Problem List   Diagnosis Date Noted  . Normal labor 03/06/2018  . 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 Supervision of normal first pregnancy 08/23/2017    Assessment / Plan: 22 y.o. G1P0 at 5130w0d here for SOL  Labor: active labor, progressing well Fetal Wellbeing:  Cat I Pain Control:  Epidural  Anticipated MOD:  SVD  Jennifer GunnerAaron B Thompson, MD PGY-1 6/17/20195:20 AM

## 2018-03-06 NOTE — Anesthesia Procedure Notes (Signed)
Epidural Patient location during procedure: OB Start time: 03/06/2018 1:25 AM End time: 03/06/2018 1:43 AM  Staffing Anesthesiologist: Jairo BenJackson, Faraaz Wolin, MD Performed: anesthesiologist   Preanesthetic Checklist Completed: patient identified, surgical consent, pre-op evaluation, timeout performed, IV checked, risks and benefits discussed and monitors and equipment checked  Epidural Patient position: sitting Prep: site prepped and draped and DuraPrep Patient monitoring: blood pressure, continuous pulse ox and heart rate Approach: midline Location: L3-L4 Injection technique: LOR air  Needle:  Needle type: Tuohy  Needle gauge: 17 G Needle length: 9 cm Needle insertion depth: 4 cm Catheter type: closed end flexible Catheter size: 19 Gauge Catheter at skin depth: 9.5 cm Test dose: negative (1% lidocaine)  Assessment Events: blood not aspirated, injection not painful, no injection resistance, negative IV test and no paresthesia  Additional Notes Pt identified in Labor room.  Monitors applied. Working IV access confirmed. Sterile prep, drape lumbar spine.  1% lido local L 3,4.  #17ga Touhy LOR air at 4 cm L 3,4, cath in easily to 9 cm skin. Test dose OK, cath dosed and infusion begun.  Patient asymptomatic, VSS, no heme aspirated, tolerated well.  Sandford Craze Robby Pirani, MDReason for block:procedure for pain

## 2018-03-06 NOTE — Consult Note (Signed)
Neonatology Note:  Attendance at Code Apgar:   Our team responded to a Code Apgar call to room # 163 following NSVD, due to infant with apnea. The requesting physician was Dr. Mearl LatinHipkins. The mother is a G1P0 , GBS neg with good prenatal care. ROM occurred 1 hours PTD and the fluid was clear. Variables present.  At delivery, the baby apneic and with poor tone. The OB nursing staff in attendance, cut cord immediately, brought infant to warmer and gave vigorous stimulation.  HR <100 and PPV started with some improvement.  After repeat decline of SAo2 and HR, a Code Apgar was called. Our team arrived at 3-4 minutes of life, at which time the baby was receiving blow by O2. I noted HR at 140 of a pink infant with flexed position. Upon further stimulation, infant began crying loudly and activity increased.  Sao2 placed and in mid 70s with improvement to 90s quickly.  Good and equal air exchange.  BBO2 removed.  Vitals stable without WOB or tachypnea.  Ap 5 (assigned by L&D)/9.  I spoke with the parents in the DR, then transferred the baby to the Pediatrician's care.   Please do not hesitate in contacting us if further concerns.   Dineen Kidavid C. Leary RocaEhrmann, MD

## 2018-03-06 NOTE — Anesthesia Postprocedure Evaluation (Signed)
Anesthesia Post Note  Patient: Donita Brooksakota Garcialopez  Procedure(s) Performed: AN AD HOC LABOR EPIDURAL     Patient location during evaluation: Mother Baby Anesthesia Type: Epidural Level of consciousness: awake, awake and alert and oriented Pain management: pain level controlled Vital Signs Assessment: post-procedure vital signs reviewed and stable Respiratory status: spontaneous breathing, nonlabored ventilation and respiratory function stable Cardiovascular status: stable Postop Assessment: no headache, no backache, patient able to bend at knees, no apparent nausea or vomiting, adequate PO intake and able to ambulate Anesthetic complications: no    Last Vitals:  Vitals:   03/06/18 1205 03/06/18 1610  BP: 119/76 126/74  Pulse: 77 87  Resp: 16 16  Temp: 36.6 C 36.8 C  SpO2: 98%     Last Pain:  Vitals:   03/06/18 1610  TempSrc: Oral  PainSc:    Pain Goal:                 Deziya Amero

## 2018-03-06 NOTE — Anesthesia Preprocedure Evaluation (Addendum)
Anesthesia Evaluation  Patient identified by MRN, date of birth, ID band Patient awake    Reviewed: Allergy & Precautions, NPO status , Patient's Chart, lab work & pertinent test results  Airway Mallampati: II  TM Distance: >3 FB Neck ROM: Full    Dental  (+) Dental Advisory Given   Pulmonary asthma , former smoker,    breath sounds clear to auscultation       Cardiovascular negative cardio ROS   Rhythm:Regular Rate:Normal     Neuro/Psych Anxiety    GI/Hepatic negative GI ROS, Neg liver ROS,   Endo/Other  negative endocrine ROS  Renal/GU negative Renal ROS     Musculoskeletal   Abdominal   Peds  Hematology Hb 10.3, plt 233k   Anesthesia Other Findings   Reproductive/Obstetrics (+) Pregnancy                             Anesthesia Physical Anesthesia Plan  ASA: II  Anesthesia Plan: Epidural   Post-op Pain Management:    Induction:   PONV Risk Score and Plan: 2 and Treatment may vary due to age or medical condition  Airway Management Planned: Natural Airway  Additional Equipment:   Intra-op Plan:   Post-operative Plan:   Informed Consent: I have reviewed the patients History and Physical, chart, labs and discussed the procedure including the risks, benefits and alternatives for the proposed anesthesia with the patient or authorized representative who has indicated his/her understanding and acceptance.   Dental advisory given  Plan Discussed with:   Anesthesia Plan Comments: (Patient identified. Risks/Benefits/Options discussed with patient including but not limited to bleeding, infection, nerve damage, paralysis, failed block, incomplete pain control, headache, blood pressure changes, nausea, vomiting, reactions to medication both or allergic, itching and postpartum back pain. Confirmed with bedside nurse the patient's most recent platelet count. Confirmed with patient that  they are not currently taking any anticoagulation, have any bleeding history or any family history of bleeding disorders. Patient expressed understanding and wished to proceed. All questions were answered. )        Anesthesia Quick Evaluation

## 2018-03-06 NOTE — Telephone Encounter (Signed)
Lmom for pt to call us back to schedule postpartum appointment.  03-06-18  AS

## 2018-03-07 ENCOUNTER — Telehealth: Payer: Self-pay | Admitting: *Deleted

## 2018-03-07 NOTE — Progress Notes (Signed)
Post Partum Day 1 Subjective: no complaints, up ad lib, voiding and tolerating PO. Having difficulty with latch and baby Jennifer Obrien has been elevated. Wants to give baby a bottle. [facility]   Objective: Blood pressure 112/69, pulse 92, temperature 98.2 F (36.8 C), temperature source Oral, resp. rate 20, height 5\' 4"  (1.626 m), weight 159 lb (72.1 kg), last menstrual period 05/30/2017, SpO2 99 %, unknown if currently breastfeeding.  Physical Exam:  General: alert, cooperative and fatigued Lochia: appropriate Uterine Fundus: firm Incision: NA DVT Evaluation: No evidence of DVT seen on physical exam.  Recent Labs    03/06/18 0057  HGB 10.3*  HCT 30.9*    Assessment/Plan: Plan for discharge tomorrow, Lactation consult and Contraception POPs   LOS: 1 day   Jennifer Obrien 03/07/2018, 11:40 AM

## 2018-03-07 NOTE — Progress Notes (Signed)
Parent request formula to supplement breast feeding due to patient is tired and difficulty latching. Parents have been informed of small tummy size of newborn, taught hand expression and understands the possible consequences of formula to the health of the infant. The possible consequences shared with patent include 1) Loss of confidence in breastfeeding 2) Engorgement 3) Allergic sensitization of baby(asthema/allergies) and 4) decreased milk supply for mother.After discussion of the above the mother decided to bottle feed formula.The  tool used to give formula supplement will be a bottle.  Jennifer Krahn, RN 03/07/2018 2:50 AM   

## 2018-03-07 NOTE — Telephone Encounter (Signed)
Lmom for pt to call us back to schedule postpartum appointment.  03-07-18  AS

## 2018-03-07 NOTE — Progress Notes (Signed)
MOB was referred for history of depression/anxiety. * Referral screened out by Clinical Social Worker because none of the following criteria appear to apply: ~ History of anxiety/depression during this pregnancy, or of post-partum depression. Per MOB's chart review MOB denies anxiety and symptoms are attributed to MOB's asthma.  ~ Diagnosis of anxiety and/or depression within last 3 years OR * MOB's symptoms currently being treated with medication and/or therapy.  Please contact the Clinical Social Worker if needs arise, by MOB request, or if MOB scores greater than 9/yes to question 10 on Edinburgh Postpartum Depression Screen.  Mylea Roarty Boyd-Gilyard, MSW, LCSW Clinical Social Work [phone] 

## 2018-03-08 MED ORDER — NORETHINDRONE 0.35 MG PO TABS: 1.0000 | ORAL_TABLET | Freq: Every day | ORAL | 11 refills | Status: DC

## 2018-03-08 NOTE — Discharge Summary (Addendum)
Jennifer Discharge Summary     Patient Name: Jennifer BrooksDakota Obrien DOB: [birth year] MRN: [id number]  Date of admission: 03/05/2018 Delivering MD: Jennifer Obrien   Date of discharge: 03/08/2018  Admitting diagnosis: 40 WKS, CTXS Intrauterine pregnancy: 3072w0d     Secondary diagnosis:  Active Problems:   Normal labor   Spontaneous vaginal delivery  Additional problems: none     Discharge diagnosis: Term Pregnancy Delivered                                                                                                Post partum procedures:none  Augmentation: none  Complications: None  Hospital course:  Onset of Labor With Vaginal Delivery     22 y.o. yo G1P1001 at 172w0d was admitted in Active Labor on 03/05/2018. Patient had an uncomplicated labor course as follows:  Membrane Rupture Time/Date: 8:46 AM ,03/06/2018   Intrapartum Procedures: Episiotomy: None [1]                                         Lacerations:  2nd degree [3]  Patient had a delivery of a Viable infant. 03/06/2018  Information for the patient's newborn:  Jennifer Obrien, Girl Jennifer CarolinaDakota [[id number]][[id number]]  Delivery Method: Vaginal, Spontaneous(Filed from Delivery Summary)    Pateint had an uncomplicated postpartum course.  She is ambulating, tolerating a regular diet, passing flatus, and urinating well. Patient is discharged home in stable condition on 03/08/18.   Physical exam  Vitals:   03/07/18 0003 03/07/18 0551 03/07/18 1506 03/08/18 0541  BP: 110/72 112/69 124/76 110/66  Pulse: 88 92 (!) 112 62  Resp: 20 20 16 18   Temp: 97.8 F (36.6 C) 98.2 F (36.8 C) 98.6 F (37 C) 98.4 F (36.9 C)  TempSrc: Oral Oral Oral Oral  SpO2: 99% 99%    Weight:      Height:       General: alert, cooperative and no distress Lochia: appropriate Uterine Fundus: firm Incision: N/A DVT Evaluation: No evidence of DVT seen on physical exam. No cords or calf tenderness. No significant calf/ankle edema. Labs: Lab Results  Component Value  Date   WBC 14.2 (H) 03/06/2018   HGB 10.3 (L) 03/06/2018   HCT 30.9 (L) 03/06/2018   MCV 93.9 03/06/2018   PLT 233 03/06/2018   No flowsheet data found.  Discharge instruction: per After Visit Summary and "Baby and Me Booklet".  After visit meds:  Allergies as of 03/08/2018      Reactions   Bee Venom       Medication List    TAKE these medications   albuterol 108 (90 Base) MCG/ACT inhaler Commonly known as:  PROVENTIL HFA;VENTOLIN HFA Inhale into the lungs.   multivitamin-prenatal 27-0.8 MG Tabs tablet Take 1 tablet by mouth daily at 12 noon.   norethindrone 0.35 MG tablet Commonly known as:  ORTHO MICRONOR Take 1 tablet (0.35 mg total) by mouth daily.   ondansetron 4 MG disintegrating tablet Commonly known as:  ZOFRAN-ODT Take 4 mg  by mouth every 8 (eight) hours as needed for nausea or vomiting.   valACYclovir 1000 MG tablet Commonly known as:  VALTREX Take 2 tablets (2,000 mg total) by mouth 2 (two) times daily. X 1 day       Diet: routine diet  Activity: Advance as tolerated. Pelvic rest for 6 weeks.   Outpatient follow up:4 weeks Follow up Appt: Future Appointments  Date Time Provider Department Center  04/12/2018  1:30 PM Jennifer Obrien, CNM [facility]   Follow up Visit:No follow-ups on file.  Postpartum contraception: Progesterone only pills  Newborn Data: Live born female  Birth Weight: 6 lb 5.4 oz (2875 g) APGAR: 5, 9  Newborn Delivery   Birth date/time:  03/06/2018 09:39:00 Delivery type:  Vaginal, Spontaneous     Baby Feeding: Breast Disposition:home with mother   03/08/2018 Jennifer Gage, MD  Jennifer Obrien  I have seen and examined this patient and agree with above documentation in the resident's note.   Jennifer Pear, MD Jennifer Fellow 03/08/2018

## 2018-03-09 ENCOUNTER — Other Ambulatory Visit: Payer: Medicaid Other | Admitting: Advanced Practice Midwife

## 2018-03-13 ENCOUNTER — Telehealth: Payer: Self-pay | Admitting: Obstetrics & Gynecology

## 2018-03-13 NOTE — Telephone Encounter (Signed)
Pt called asking when she should start taking the birth control pill that she was prescribed in the hospital. Advised to start after she has had her postpartum visit and to discuss with provider at that visit.

## 2018-03-19 ENCOUNTER — Encounter: Payer: Self-pay | Admitting: Advanced Practice Midwife

## 2018-04-12 ENCOUNTER — Ambulatory Visit (INDEPENDENT_AMBULATORY_CARE_PROVIDER_SITE_OTHER): Payer: Medicaid Other | Admitting: Advanced Practice Midwife

## 2018-04-12 ENCOUNTER — Encounter: Payer: Self-pay | Admitting: Advanced Practice Midwife

## 2018-04-12 DIAGNOSIS — Z1389 Encounter for screening for other disorder: Secondary | ICD-10-CM | POA: Diagnosis not present

## 2018-04-12 MED ORDER — NORETHIN-ETH ESTRAD-FE BIPHAS 1 MG-10 MCG / 10 MCG PO TABS: 1.0000 | ORAL_TABLET | Freq: Every day | ORAL | 11 refills | Status: DC

## 2018-04-12 NOTE — Progress Notes (Signed)
[place] Jennifer Obrien is a 22 y.o. who presents for a postpartum visit. She is 6 weeks postpartum following a spontaneous vaginal delivery. I have fully reviewed the prenatal and intrapartum course. The delivery was at 40 gestational weeks.  Anesthesia: epidural. Postpartum course has been uneventful. Baby's course has been uneventful. Baby is feeding by bottle (breastfed some). Bleeding: started period. Bowel function is normal. Bladder function is normal. Patient is not sexually active. Contraception method is none. Postpartum depression screening: negative.   Current Outpatient Medications:  .  albuterol (PROVENTIL HFA;VENTOLIN HFA) 108 (90 Base) MCG/ACT inhaler, Inhale into the lungs., Disp: , Rfl:  .  norethindrone (ORTHO MICRONOR) 0.35 MG tablet, Take 1 tablet (0.35 mg total) by mouth daily., Disp: 1 Package, Rfl: 11 .  Norethindrone-Ethinyl Estradiol-Fe Biphas (LO LOESTRIN FE) 1 MG-10 MCG / 10 MCG tablet, Take 1 tablet by mouth daily., Disp: 1 Package, Rfl: 11 .  ondansetron (ZOFRAN-ODT) 4 MG disintegrating tablet, Take 4 mg by mouth every 8 (eight) hours as needed for nausea or vomiting. , Disp: , Rfl: 0 .  Prenatal Vit-Fe Fumarate-FA (MULTIVITAMIN-PRENATAL) 27-0.8 MG TABS tablet, Take 1 tablet by mouth daily at 12 noon., Disp: , Rfl:  .  valACYclovir (VALTREX) 1000 MG tablet, Take 2 tablets (2,000 mg total) by mouth 2 (two) times daily. X 1 day, Disp: 4 tablet, Rfl: 3  Review of Systems   Constitutional: Negative for fever and chills Eyes: Negative for visual disturbances Respiratory: Negative for shortness of breath, dyspnea Cardiovascular: Negative for chest pain or palpitations  Gastrointestinal: Negative for vomiting, diarrhea and constipation Genitourinary: Negative for dysuria and urgency Musculoskeletal: Negative for back pain, joint pain, myalgias  Neurological: Negative for dizziness and headaches    Objective:     Vitals:   04/12/18 1325  BP: 136/82  Pulse: 76   General:   alert, cooperative and no distress   Breasts:  negative  Lungs: Normal respiratory effort  Heart:  regular rate and rhythm  Abdomen: Soft, nontender   Vulva:  normal  Vagina: normal vagina  Cervix:  closed  Corpus: Well involuted     Rectal Exam: no hemorrhoids        Assessment:    normal postpartum exam.  Plan:   1. Contraception: start COCs today (on period) 2. Follow up in: 3 months or as needed.

## 2018-04-12 NOTE — Patient Instructions (Signed)
Oral Contraception Information Oral contraceptive pills (OCPs) are medicines taken to prevent pregnancy. OCPs work by preventing the ovaries from releasing eggs. The hormones in OCPs also cause the cervical mucus to thicken, preventing the sperm from entering the uterus. The hormones also cause the uterine lining to become thin, not allowing a fertilized egg to attach to the inside of the uterus. OCPs are highly effective when taken exactly as prescribed. However, OCPs do not prevent sexually transmitted diseases (STDs). Safe sex practices, such as using condoms along with the pill, can help prevent STDs. Before taking the pill, you may have a physical exam and Pap test. Your health care provider may order blood tests. The health care provider will make sure you are a good candidate for oral contraception. Discuss with your health care provider the possible side effects of the OCP you may be prescribed. When starting an OCP, it can take 2 to 3 months for the body to adjust to the changes in hormone levels in your body. Types of oral contraception  The combination pill-This pill contains estrogen and progestin (synthetic progesterone) hormones. The combination pill comes in 21-day, 28-day, or 91-day packs. Some types of combination pills are meant to be taken continuously (365-day pills). With 21-day packs, you do not take pills for 7 days after the last pill. With 28-day packs, the pill is taken every day. The last 7 pills are without hormones. Certain types of pills have more than 21 hormone-containing pills. With 91-day packs, the first 84 pills contain both hormones, and the last 7 pills contain no hormones or contain estrogen only.  The minipill-This pill contains the progesterone hormone only. The pill is taken every day continuously. It is very important to take the pill at the same time each day. The minipill comes in packs of 28 pills. All 28 pills contain the hormone. Advantages of oral  contraceptive pills  Decreases premenstrual symptoms.  Treats menstrual period cramps.  Regulates the menstrual cycle.  Decreases a heavy menstrual flow.  May treatacne, depending on the type of pill.  Treats abnormal uterine bleeding.  Treats polycystic ovarian syndrome.  Treats endometriosis.  Can be used as emergency contraception. Things that can make oral contraceptive pills less effective OCPs can be less effective if:  You forget to take the pill at the same time every day.  You have a stomach or intestinal disease that lessens the absorption of the pill.  You take OCPs with other medicines that make OCPs less effective, such as antibiotics, certain HIV medicines, and some seizure medicines.  You take expired OCPs.  You forget to restart the pill on day 7, when using the packs of 21 pills.  Risks associated with oral contraceptive pills Oral contraceptive pills can sometimes cause side effects, such as:  Headache.  Nausea.  Breast tenderness.  Irregular bleeding or spotting.  Combination pills are also associated with a small increased risk of:  Blood clots.  Heart attack.  Stroke.  This information is not intended to replace advice given to you by your health care provider. Make sure you discuss any questions you have with your health care provider. Document Released: 11/27/2002 Document Revised: 02/12/2016 Document Reviewed: 02/25/2013 Elsevier Interactive Patient Education  2018 Elsevier Inc.  

## 2018-04-17 ENCOUNTER — Telehealth: Payer: Self-pay | Admitting: Obstetrics & Gynecology

## 2018-04-17 NOTE — Telephone Encounter (Signed)
Pt called asking which OCP that she should take, the one prescribed to her at the hospital or the one [facility] prescribed. She states that she is one week into the micronor pack. Advised to finish out that pack but use back up birth control as she is no longer breastfeeding. Advised to switch to the Lo Loestrin next month. Pt verbalized understanding.

## 2018-06-14 ENCOUNTER — Telehealth: Payer: Self-pay | Admitting: Advanced Practice Midwife

## 2018-06-14 NOTE — Telephone Encounter (Signed)
Please call pt in ref to break through bleeding with birth control/pills

## 2018-06-14 NOTE — Telephone Encounter (Signed)
Patient states she is having a period every other week lasting 6-10 days each time.  Changed BCP about 2 months ago.  Please advise.

## 2018-06-15 ENCOUNTER — Other Ambulatory Visit: Payer: Self-pay | Admitting: Advanced Practice Midwife

## 2018-06-15 NOTE — Progress Notes (Signed)
Stopped micronoir in June, on [place] for 2 months.  GIve it one more month, normal SE for up to 2 packs.  Pt sent mychart message

## 2018-06-15 NOTE — Telephone Encounter (Signed)
That is an expected side effect of Micronoir.  She will need an estrogen pill to stop that SE, but can't take estrogen untel she quits breastfeeding.

## 2018-07-13 ENCOUNTER — Ambulatory Visit: Payer: Medicaid Other | Admitting: Advanced Practice Midwife

## 2018-12-12 IMAGING — US US ABDOMEN LIMITED
1 series · 14 of 22 positions shown · non-contrast
Comparison: None in PACs

CLINICAL DATA: Left upper quadrant pain for the past 2 weeks.
History of partial splenectomy in 2455. The patient is 25 weeks
pregnant..

EXAM:
ULTRASOUND ABDOMEN LIMITED

[Series 1: us abdomen limited · 0.21mm/px · 14 of 22 slices shown]
[im 1/22]
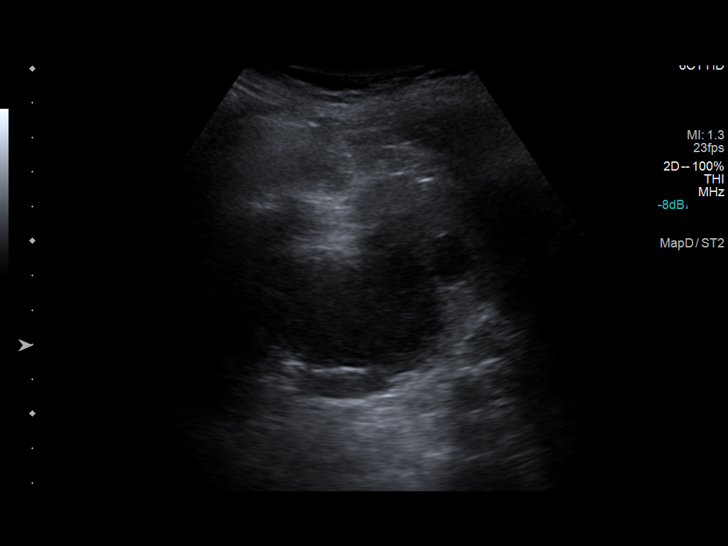
[im 3/22]
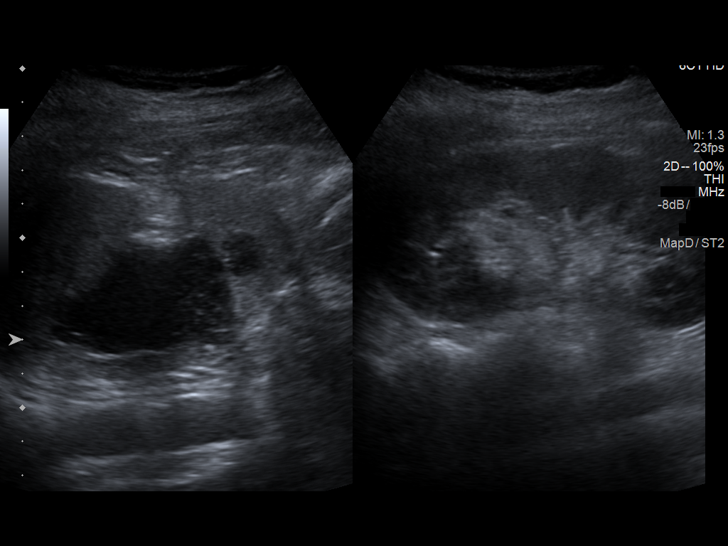
[im 4/22]
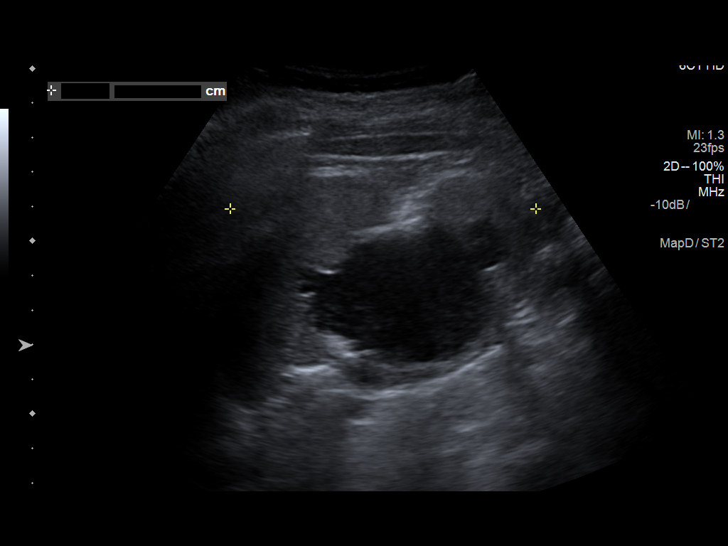
[im 6/22]
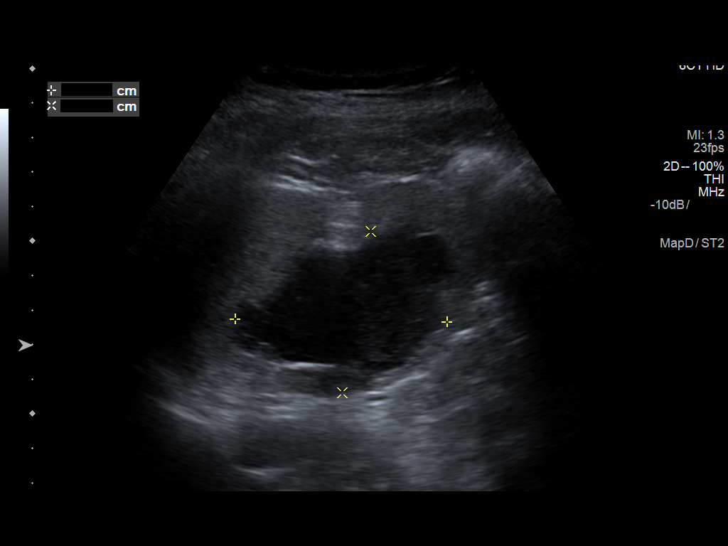
[im 8/22]
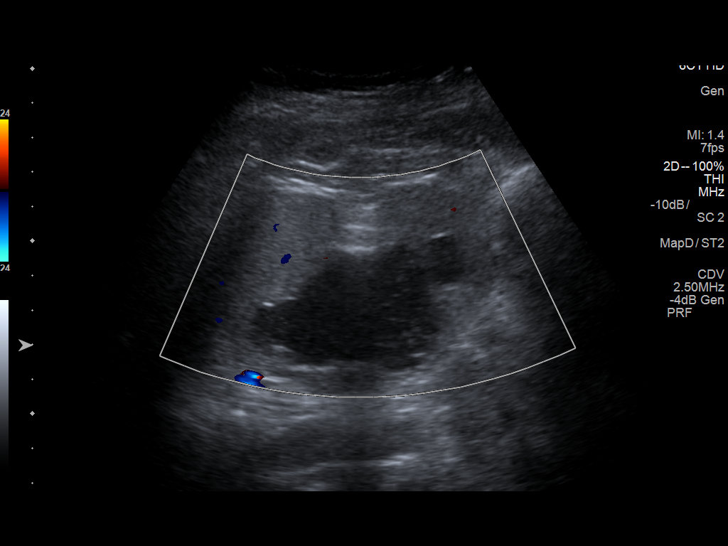
[im 9/22]
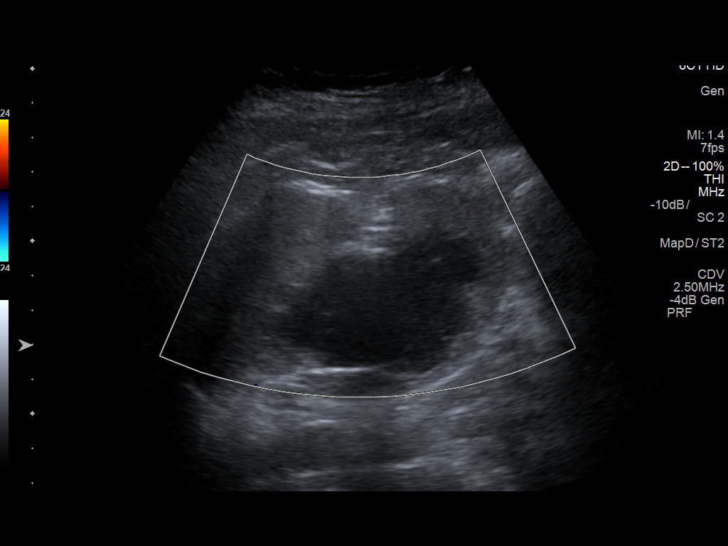
[im 11/22]
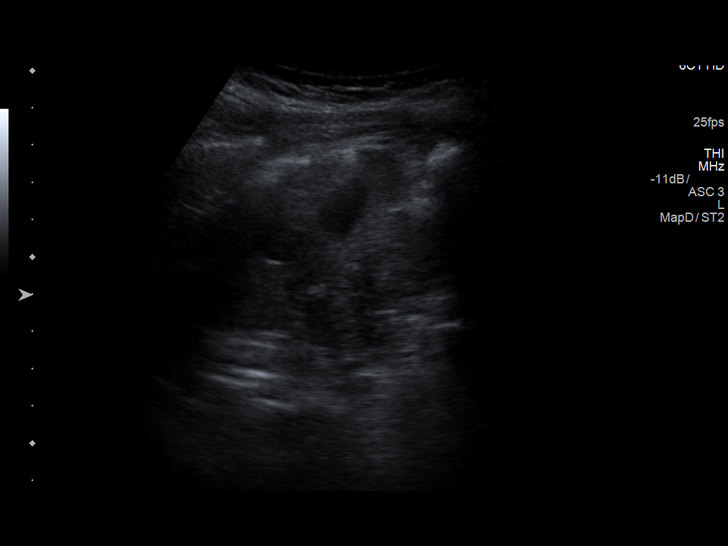
[im 12/22]
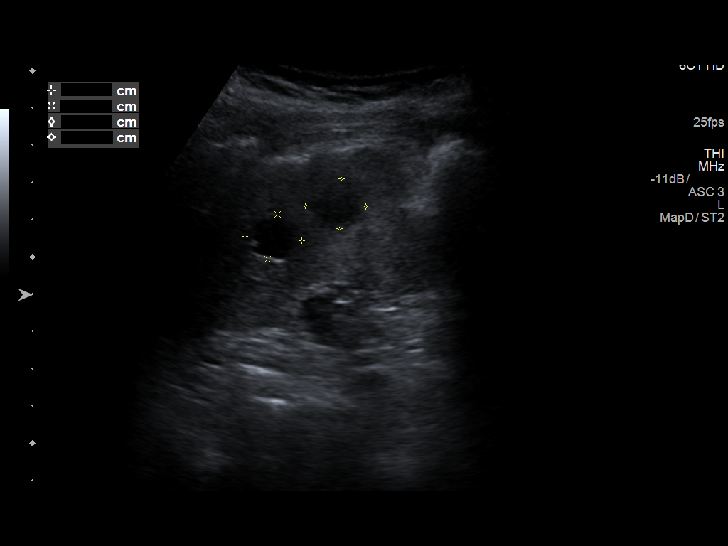
[im 14/22]
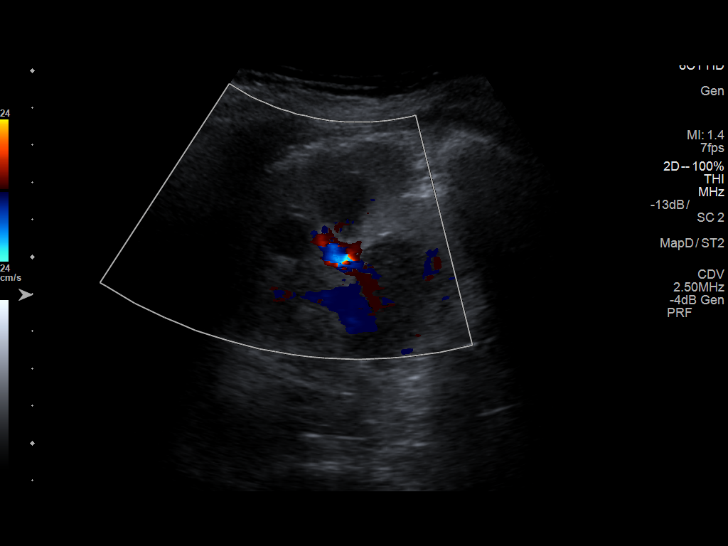
[im 15/22]
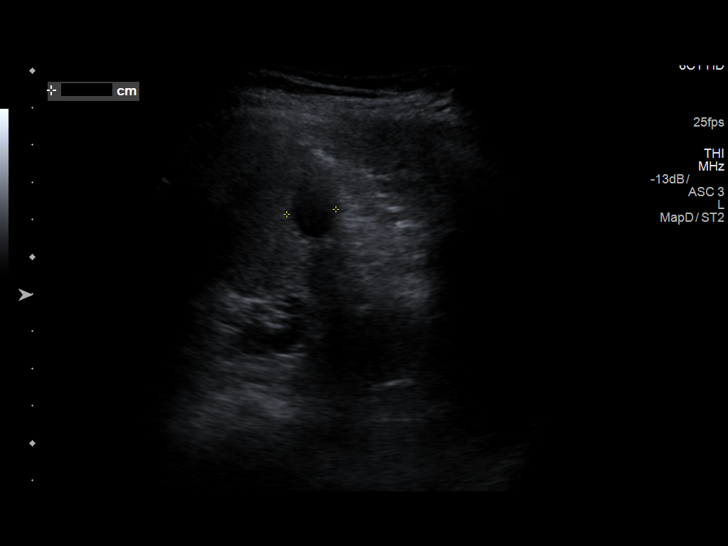
[im 17/22]
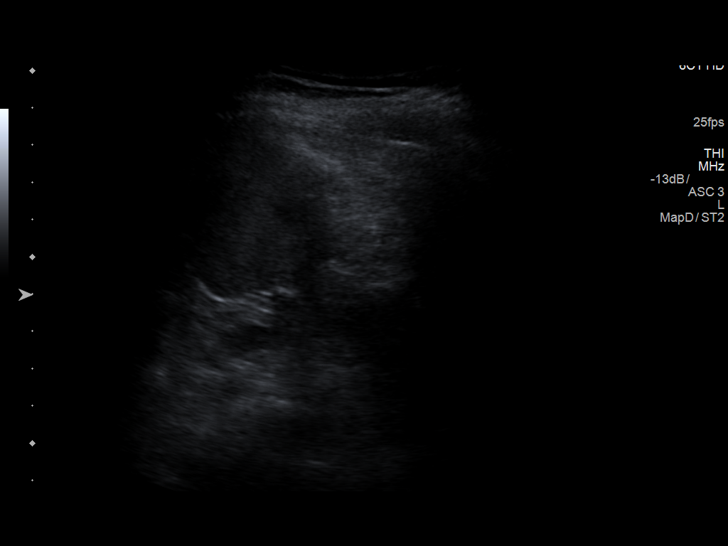
[im 19/22]
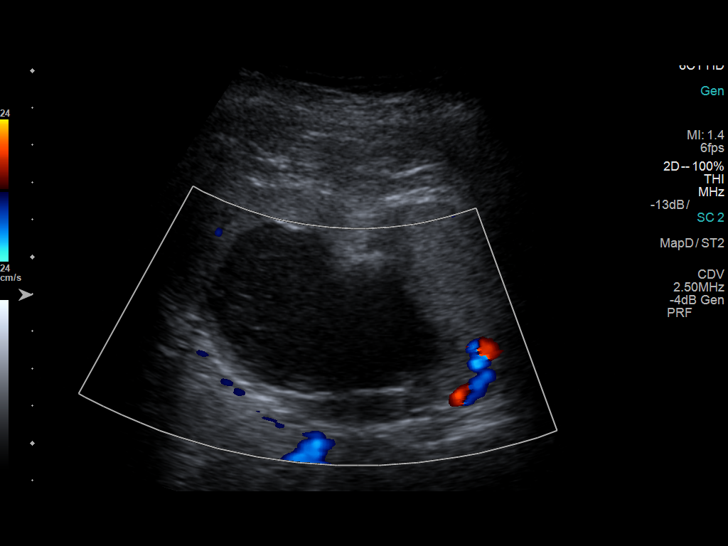
[im 20/22]
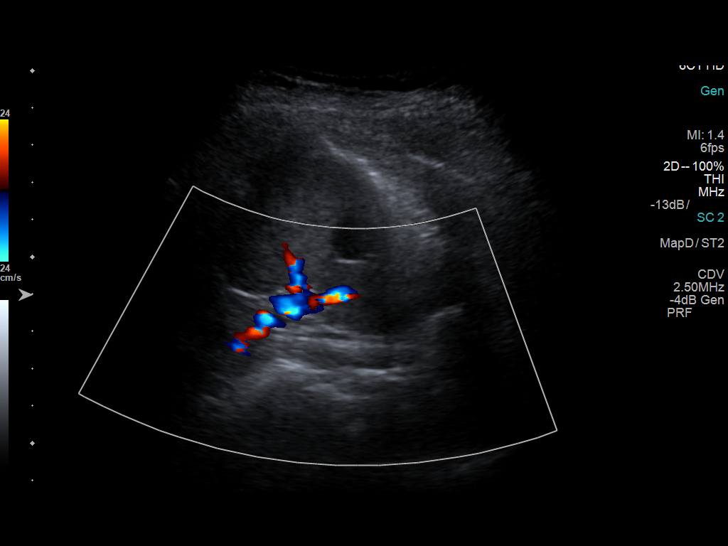
[im 22/22]
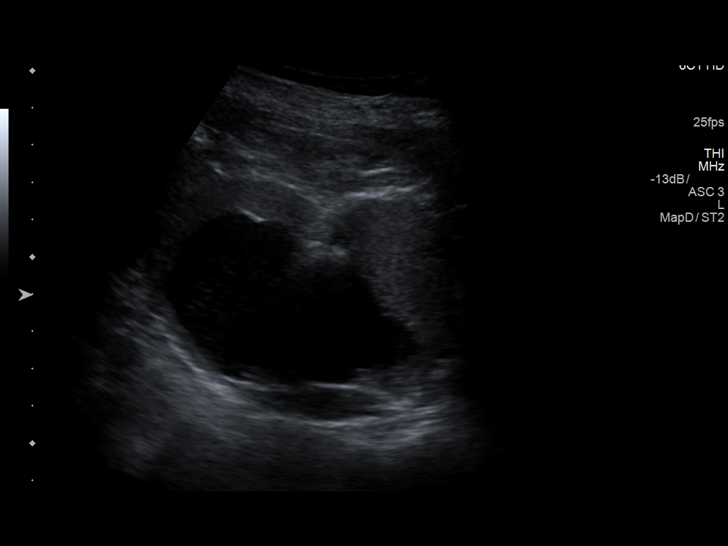

[14 of 22 positions shown; findings below may reference images not displayed]

FINDINGS: The spleen measures 8.9 cm in length. There are 3 cysts within the
spleen. One exhibits irregular thickened walls and measures 6.1 x
4.7 x 7.1 cm. There is a simple cyst measuring 1.5 cm in diameter
and a second simple cyst measuring 1.6 cm in diameter. No abnormal
vascularity is noted within or surrounding the cysts.
IMPRESSION: Multiple splenic cysts. The spleen itself does not appear enlarged.
One of the cysts which measures 7.1 cm in greatest dimension
demonstrates thickened walls.

## 2021-02-11 ENCOUNTER — Encounter: Payer: Self-pay | Admitting: Adult Health

## 2021-02-11 ENCOUNTER — Ambulatory Visit: Payer: Self-pay | Admitting: Adult Health

## 2021-02-11 ENCOUNTER — Other Ambulatory Visit: Payer: Self-pay

## 2021-02-11 VITALS — BP 114/78 | HR 99 | Ht 64.0 in | Wt 122.5 lb

## 2021-02-11 DIAGNOSIS — R1031 Right lower quadrant pain: Secondary | ICD-10-CM

## 2021-02-11 DIAGNOSIS — N926 Irregular menstruation, unspecified: Secondary | ICD-10-CM

## 2021-02-11 DIAGNOSIS — Z3202 Encounter for pregnancy test, result negative: Secondary | ICD-10-CM | POA: Insufficient documentation

## 2021-02-11 LAB — POCT URINE PREGNANCY: Preg Test, Ur: NEGATIVE

## 2021-02-11 NOTE — Progress Notes (Signed)
  Subjective:     Patient ID: Jennifer Obrien, female   DOB: [birth year], 25 y.o.   MRN: [id number]  HPI [name] is a 25 year old white female,single, G1P1 in complaining of irregular periods. She is on OCs and she said PCP, Dr Lyda Perone, wonders if she has fibroids or cyst, she has cyst in her spleen. She is self pay.  PCP is Dr Lyda Perone.  Review of Systems Pt says periods have never been regular,started a age [age] and would skip 3-4 months then got pregnant and is on OCs now and periods may last 3 months Reviewed past medical,surgical, social and family history. Reviewed medications and allergies.     Objective:   Physical Exam BP 114/78 (BP Location: Left Arm, Patient Position: Sitting, Cuff Size: Normal)   Pulse 99   Ht 5\' 4"  (1.626 m)   Wt 122 lb 8 oz (55.6 kg)   LMP 02/04/2021   Breastfeeding No   BMI 21.03 kg/m  UPT is negative.  Skin warm and dry.Pelvic: external genitalia is normal in appearance no lesions, vagina:pink good moisture no discharge or odor,urethra has no lesions or masses noted, cervix:smooth and bulbous, uterus: normal size, shape and contour, non tender, no masses felt, adnexa: no masses, RLQ tenderness noted. Bladder is non tender and no masses felt.    fall risk is low  [facility] - 02/11/21 1404      Pregnancy Intention Screening   Does the patient want to become pregnant in the next year? No    Does the patient's partner want to become pregnant in the next year? No    Would the patient like to discuss contraceptive options today? No      Contraception Wrap Up   Current Method Oral Contraceptive    End Method Oral Contraceptive         Examination chaperoned by 02/13/21 LPN Assessment:     1. Pregnancy examination or test, negative result  2. Irregular periods On OCs,continue for now  3. RLQ abdominal pain Will get pelvic Malachy Mood at [facility] at 02/17/21 at 12:30 pm with full bladder    Plan:     Follow up in 2 weeks for pap and 02/19/21 review

## 2021-02-12 ENCOUNTER — Telehealth: Payer: Self-pay | Admitting: Adult Health

## 2021-02-12 DIAGNOSIS — R1031 Right lower quadrant pain: Secondary | ICD-10-CM

## 2021-02-12 DIAGNOSIS — N926 Irregular menstruation, unspecified: Secondary | ICD-10-CM

## 2021-02-12 NOTE — Telephone Encounter (Signed)
Pt requesting order for pelvic U/S be sent to [facility]  Her out of pocket cost is much less verses cost for [employer]  (order in Epic may can be changed to reflect [facility] - then they'll be able to schedule)   Please advise & call pt to notify of status

## 2021-02-12 NOTE — Telephone Encounter (Signed)
Pt aware that order sent to GI

## 2021-02-17 ENCOUNTER — Ambulatory Visit (HOSPITAL_COMMUNITY): Payer: Self-pay

## 2021-02-23 ENCOUNTER — Ambulatory Visit
Admission: RE | Admit: 2021-02-23 | Discharge: 2021-02-23 | Disposition: A | Discharge status: Home / Self Care | Payer: No Typology Code available for payment source | Source: Ambulatory Visit | Attending: Adult Health | Admitting: Adult Health

## 2021-02-23 DIAGNOSIS — R1031 Right lower quadrant pain: Secondary | ICD-10-CM

## 2021-02-23 DIAGNOSIS — N926 Irregular menstruation, unspecified: Secondary | ICD-10-CM

## 2021-02-25 ENCOUNTER — Other Ambulatory Visit (HOSPITAL_COMMUNITY)
Admission: RE | Admit: 2021-02-25 | Discharge: 2021-02-25 | Disposition: A | Discharge status: Home / Self Care | Payer: Self-pay | Source: Ambulatory Visit | Attending: Adult Health | Admitting: Adult Health

## 2021-02-25 ENCOUNTER — Encounter: Payer: Self-pay | Admitting: Adult Health

## 2021-02-25 ENCOUNTER — Other Ambulatory Visit: Payer: Self-pay

## 2021-02-25 ENCOUNTER — Ambulatory Visit (INDEPENDENT_AMBULATORY_CARE_PROVIDER_SITE_OTHER): Payer: Self-pay | Admitting: Adult Health

## 2021-02-25 VITALS — BP 118/78 | HR 99 | Ht 64.0 in | Wt 120.0 lb

## 2021-02-25 DIAGNOSIS — N941 Unspecified dyspareunia: Secondary | ICD-10-CM

## 2021-02-25 DIAGNOSIS — Z01419 Encounter for gynecological examination (general) (routine) without abnormal findings: Secondary | ICD-10-CM

## 2021-02-25 DIAGNOSIS — N83201 Unspecified ovarian cyst, right side: Secondary | ICD-10-CM

## 2021-02-25 DIAGNOSIS — R1031 Right lower quadrant pain: Secondary | ICD-10-CM

## 2021-02-25 NOTE — Progress Notes (Signed)
Patient ID: Jennifer Obrien, female   DOB: [birth year], 25 y.o.   MRN: [id number] History of Present Illness:  [name] is a 25 year old white female,single, G1P1 in for well woman gyn exam and first pap.PCP has her on OCs, just changed them. [name] on 02/23/21 showed 4.1 cm simple cyst right ovary.  PCP is Dr Lyda Perone.  Current Medications, Allergies, Past Medical History, Past Surgical History, Family History and Social History were reviewed in [name].     Review of Systems: Patient denies any daily headaches,(has migraines) hearing loss, fatigue, blurred vision, shortness of breath, chest pain, abdominal pain, problems with bowel movements, urination. No joint pain or mood swings. Has RLQ pain and pain with sex.    Physical Exam:BP 118/78 (BP Location: Left Arm, Patient Position: Sitting, Cuff Size: Normal)   Pulse 99   Ht 5\' 4"  (1.626 m)   Wt 120 lb (54.4 kg)   LMP 02/04/2021   BMI 20.60 kg/m  General:  Well developed, well nourished, no acute distress Skin:  Warm and dry Neck:  Midline trachea, normal thyroid, good ROM, no lymphadenopathy Lungs; Clear to auscultation bilaterally Breast:  No dominant palpable mass, retraction, or nipple discharge Cardiovascular: Regular rate and rhythm Abdomen:  Soft, non tender, no hepatosplenomegaly Pelvic:  External genitalia is normal in appearance, no lesions.  The vagina is normal in appearance,has dark blood at os. Urethra has no lesions or masses. The cervix is smooth, pap with GC/CHL performed.  Uterus is felt to be normal size, shape, and contour. +RLQ  tenderness noted.Bladder is non tender, no masses felt. Extremities/musculoskeletal:  No swelling or varicosities noted, no clubbing or cyanosis Psych:  No mood changes, alert and cooperative,seems happy AA is 1 Fall risk is low PHQ 9 score is 3 GAD 7 score is 4  Up[facility] - 02/25/21 1557      Pregnancy Intention Screening   Does the patient want to become  pregnant in the next year? No    Does the patient's partner want to become pregnant in the next year? No    Would the patient like to discuss contraceptive options today? No      Contraception Wrap Up   Current Method Oral Contraceptive    End Method Oral Contraceptive    Contraception Counseling Provided No         Examination chaperoned by 04/27/21 LPN  Impression and Plan:  1. Encounter for gynecological examination with Papanicolaou smear of cervix Pap sent Physical in 1 year Pap in 3 if normal  2. Cyst of right ovary Return in 2 weeks to talk with Dr Faith Rogue about cyst  On OCs from PCP  3. RLQ abdominal pain  4. Dyspareunia, female

## 2021-02-27 LAB — CYTOLOGY - PAP
Chlamydia: NEGATIVE
Comment: NEGATIVE
Comment: NORMAL
Diagnosis: NEGATIVE
Neisseria Gonorrhea: NEGATIVE

## 2021-09-21 IMAGING — US US PELVIS COMPLETE WITH TRANSVAGINAL
1 series · 13 of 25 positions shown · non-contrast
Comparison: None

CLINICAL DATA: RIGHT lower quadrant pain, irregular menses for 3
years, LMP 02/04/2021



[Series 1: us pelvis complete with transvaginal · 0.25mm/px · 13 of 44 slices shown]
[im 1/44]
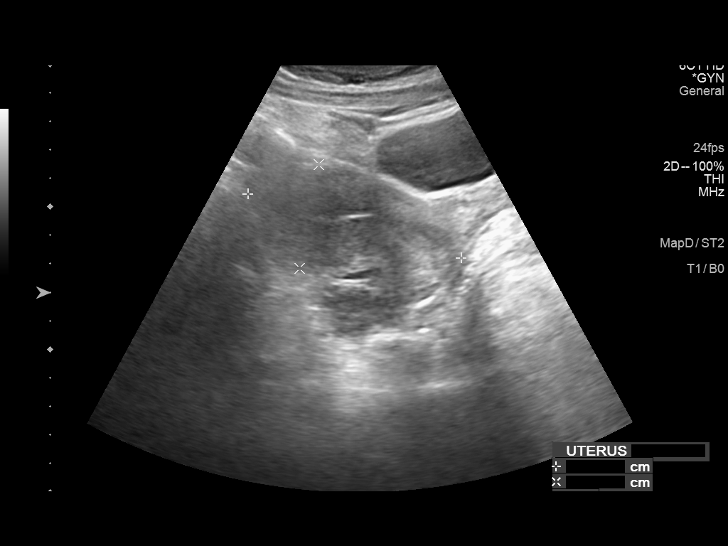
[im 4/44]
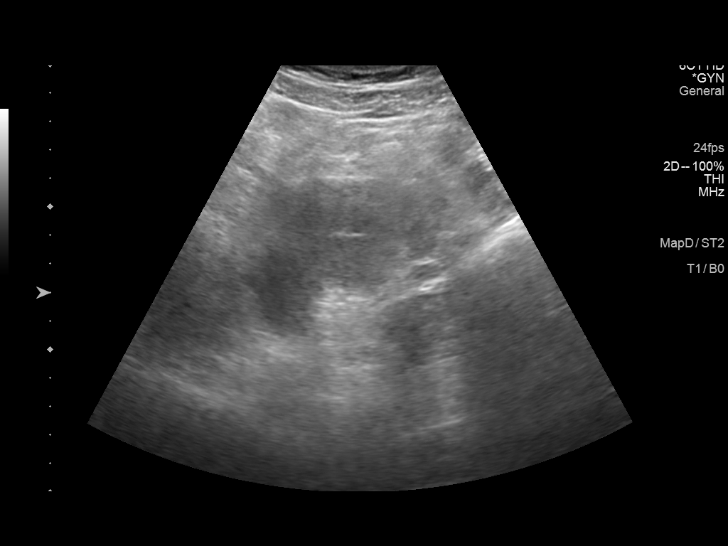
[im 8/44]
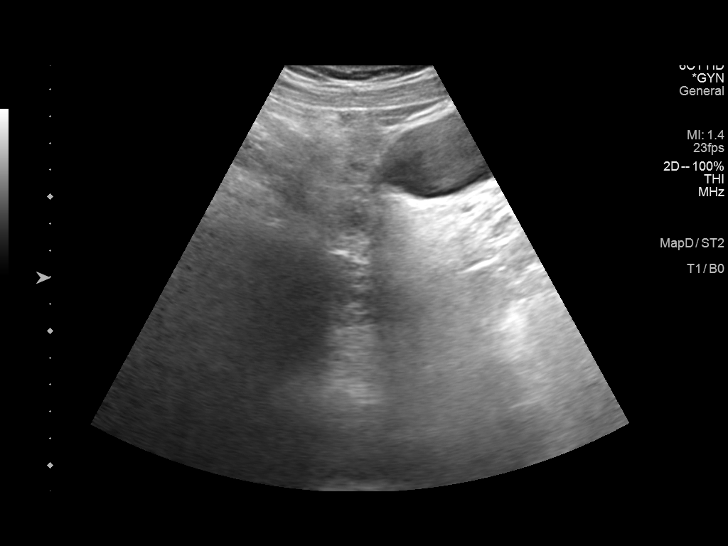
[im 11/44]
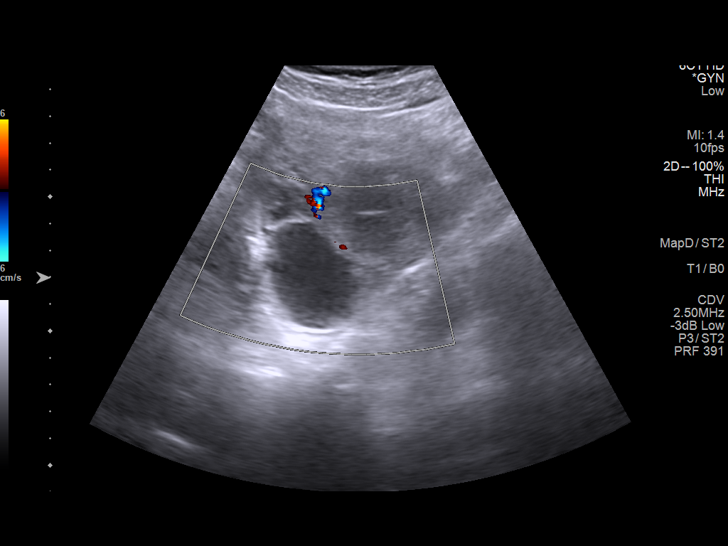
[im 15/44]
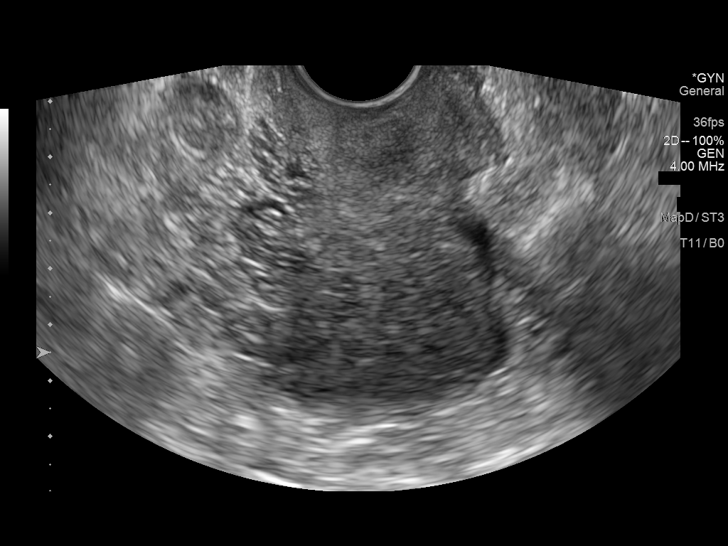
[im 18/44]
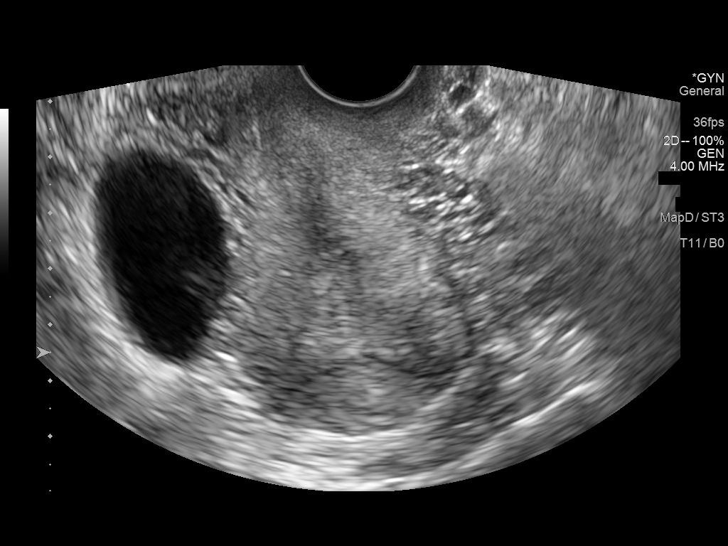
[im 22/44]
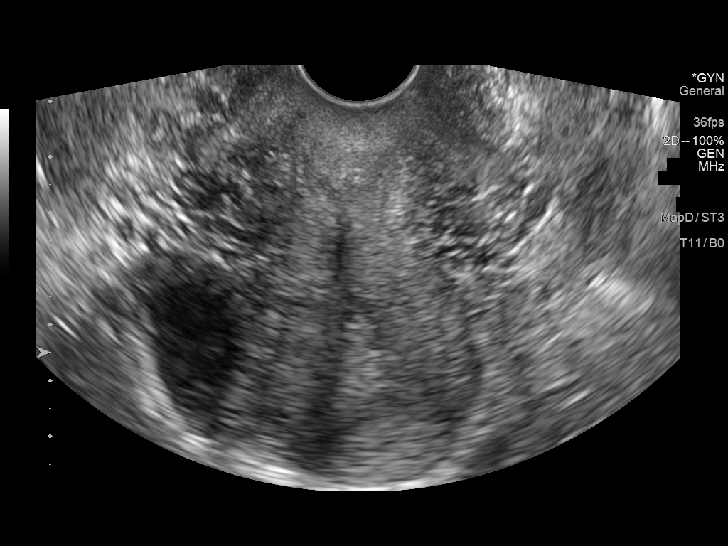
[im 26/44]
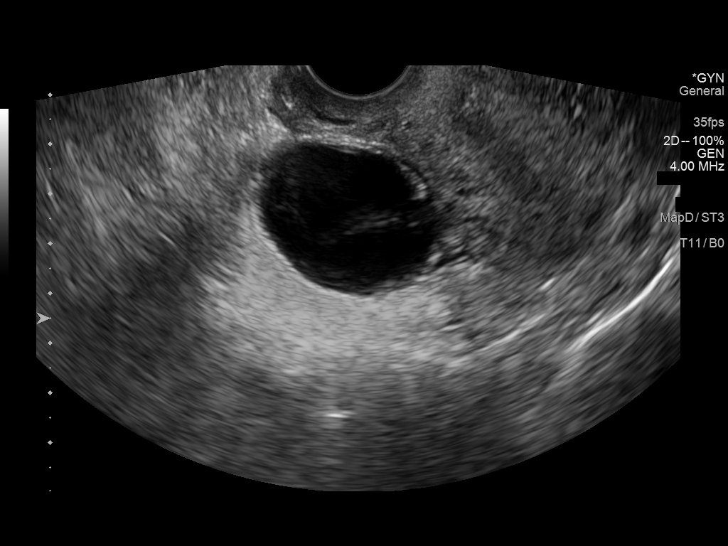
[im 29/44]
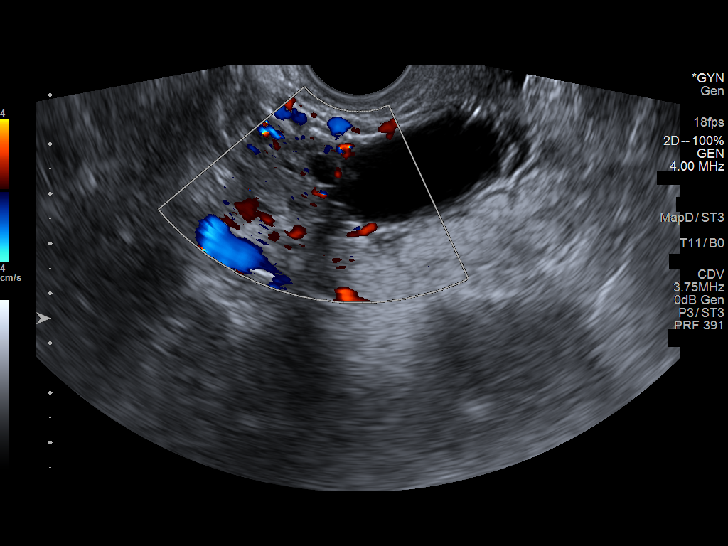
[im 33/44]
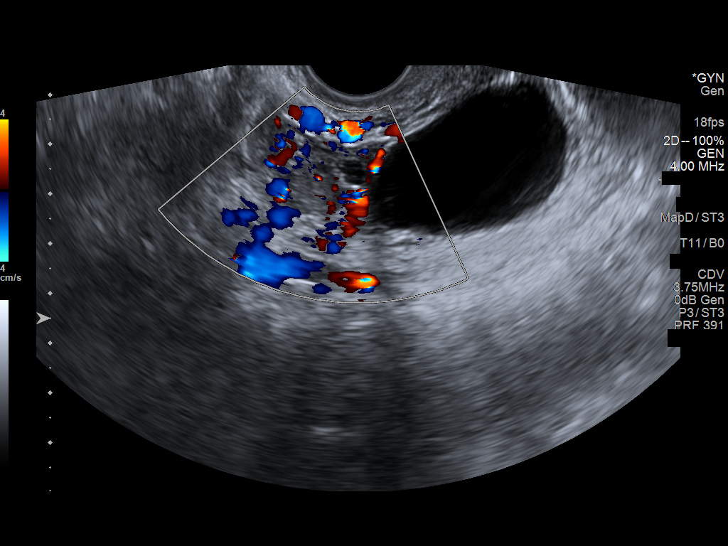
[im 36/44]
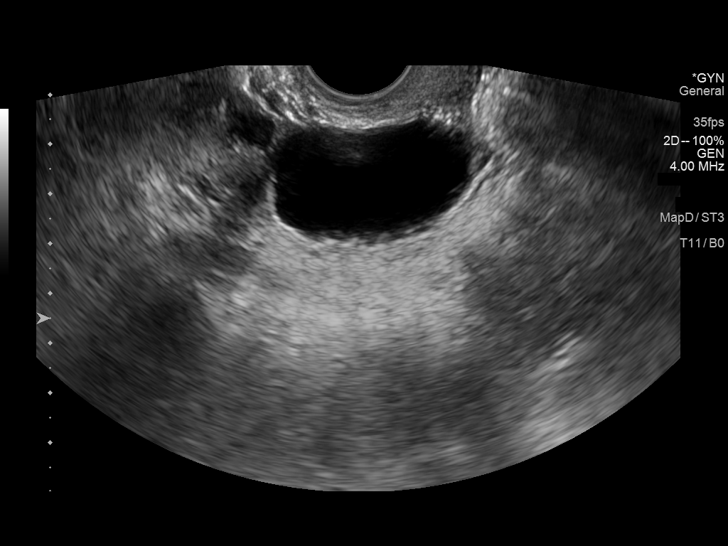
[im 40/44]
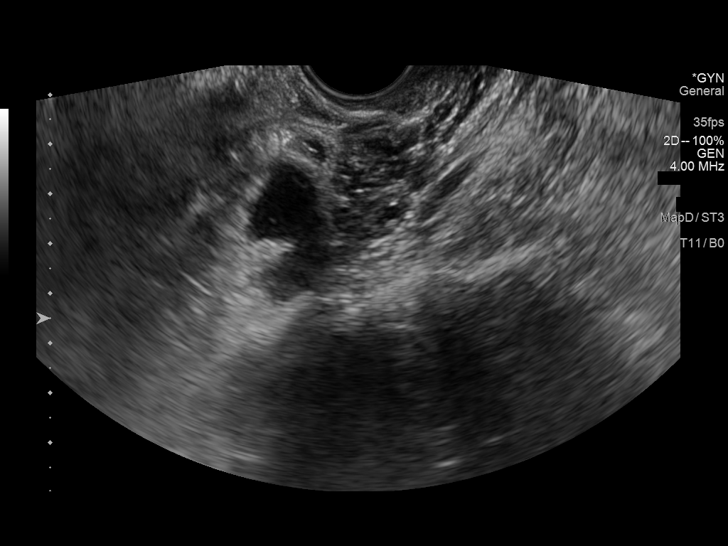
[im 44/44]
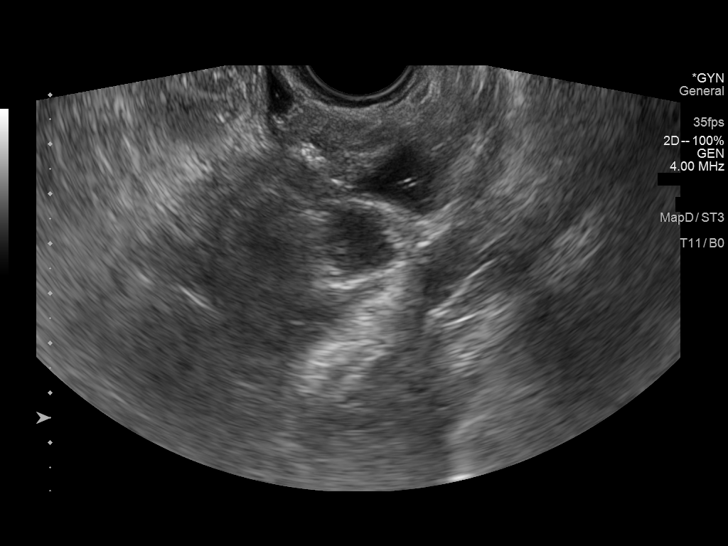

[13 of 25 positions shown; findings below may reference images not displayed]

FINDINGS: Uterus

Measurements: 7.8 x 3.7 x 6.0 cm = volume: 90 mL. Retroverted.
Heterogeneous myometrium. No focal mass.

Endometrium

Thickness: 6 mm.  No endometrial fluid or focal abnormality

Right ovary

Measurements: 5.9 x 2.4 x 3.7 cm = volume: 27.2 mL. Simple cyst
RIGHT ovary 4.1 x 2.9 x 3.6 cm No follow up imaging recommended.
Note: This recommendation does not apply to premenarchal patients or
to those with increased risk (genetic, family history, elevated
tumor markers or other high-risk factors) of ovarian cancer.
Reference: Radiology [DATE]):[id number].

Left ovary

Measurements: 2.6 x 1.4 x 1.6 cm = volume: 2.8 mL. Normal morphology
without mass

Other findings

Trace free pelvic fluid.  No adnexal masses.
IMPRESSION: 4.1 cm diameter simple cyst RIGHT ovary; no follow-up imaging
recommended, as above.

Otherwise negative exam.
